# Patient Record
Sex: Male | Born: 1985 | Race: Black or African American | Hispanic: No | Marital: Married | Smoking: Never smoker
Health system: Southern US, Community
[De-identification: ages and names within clinical notes are randomized; demographics above are authoritative.]

## PROBLEM LIST (undated history)

## (undated) DIAGNOSIS — E7800 Pure hypercholesterolemia, unspecified: Secondary | ICD-10-CM

---
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
## 2008-03-11 ENCOUNTER — Emergency Department (HOSPITAL_COMMUNITY): Admission: EM | Admit: 2008-03-11 | Discharge: 2008-03-11 | Payer: Self-pay | Admitting: Emergency Medicine

## 2009-12-01 ENCOUNTER — Emergency Department (HOSPITAL_COMMUNITY): Admission: EM | Admit: 2009-12-01 | Discharge: 2009-12-01 | Payer: Self-pay | Admitting: Emergency Medicine

## 2011-05-12 ENCOUNTER — Encounter: Payer: Self-pay | Admitting: *Deleted

## 2011-05-12 ENCOUNTER — Emergency Department (HOSPITAL_COMMUNITY)
Admission: EM | Admit: 2011-05-12 | Discharge: 2011-05-12 | Disposition: A | Discharge status: Home / Self Care | Payer: Worker's Compensation | Attending: Emergency Medicine | Admitting: Emergency Medicine

## 2011-05-12 DIAGNOSIS — X500XXA Overexertion from strenuous movement or load, initial encounter: Secondary | ICD-10-CM | POA: Insufficient documentation

## 2011-05-12 DIAGNOSIS — M545 Low back pain, unspecified: Secondary | ICD-10-CM | POA: Insufficient documentation

## 2011-05-12 DIAGNOSIS — T148XXA Other injury of unspecified body region, initial encounter: Secondary | ICD-10-CM | POA: Insufficient documentation

## 2011-05-12 DIAGNOSIS — M62838 Other muscle spasm: Secondary | ICD-10-CM | POA: Insufficient documentation

## 2011-05-12 DIAGNOSIS — IMO0001 Reserved for inherently not codable concepts without codable children: Secondary | ICD-10-CM | POA: Insufficient documentation

## 2011-05-12 MED ORDER — IBUPROFEN 600 MG PO TABS: 600.0000 mg | ORAL_TABLET | Freq: Four times a day (QID) | ORAL | Status: AC | PRN

## 2011-05-12 MED ORDER — CYCLOBENZAPRINE HCL 10 MG PO TABS: 10.0000 mg | ORAL_TABLET | Freq: Three times a day (TID) | ORAL | Status: AC | PRN

## 2011-05-12 NOTE — ED Provider Notes (Signed)
History     CSN: [id number] Arrival date & time: 05/12/2011  6:48 PM   First MD Initiated Contact with Patient 05/12/11 2025      Chief Complaint  Patient presents with  . Back Pain    pt c/o back pain that began at work while pushing an "unloading device" pt noticed sharp shooting pain in back radiating to leg. pt also c/o right hip pain. pt feels like pain is related.      HPI  History provided by the patient. Patient presents with complaints of increasing right lower back pain that began today while at work. Patient works for [employer] and reports having to push a device while leaning forward and felt strain in his back. Since that time he has had increasing pains it worse with movements and walking. he has not taken any medications for his symptoms. He also reports some pain in right buttocks area. There was no other injury or trauma. Patient denies similar symptoms in the past. She has no other significant past medical history   History reviewed. No pertinent past medical history.  History reviewed. No pertinent past surgical history.  History reviewed. No pertinent family history.  History  Substance Use Topics  . Smoking status: Never Smoker   . Smokeless tobacco: Not on file  . Alcohol Use: No      Review of Systems  Gastrointestinal: Negative for nausea, vomiting and abdominal pain.  Genitourinary: Negative for dysuria, frequency, hematuria and flank pain.  All other systems reviewed and are negative.    Allergies  Review of patient's allergies indicates no known allergies.  Home Medications   Current Outpatient Rx  Name Route Sig Dispense Refill  . IBUPROFEN 200 MG PO TABS Oral Take 200 mg by mouth every 6 (six) hours as needed. For headaches and general body pain     . MULTIVITAMINS PO CAPS Oral Take 1 capsule by mouth daily.        BP 154/85  Pulse 74  Temp(Src) 98.1 F (36.7 C) (Oral)  Resp 20  Ht 5\' 7"  (1.702 m)  Wt 165 lb (74.844 kg)  BMI 25.84  kg/m2  Physical Exam  Nursing note and vitals reviewed. Constitutional: He is oriented to person, place, and time. He appears well-developed and well-nourished. No distress.  HENT:  Head: Normocephalic.  Neck: Normal range of motion. Neck supple.  Cardiovascular: Normal rate, regular rhythm and normal heart sounds.   Pulmonary/Chest: Effort normal.  Abdominal: Soft. He exhibits no distension. There is no tenderness. There is no rebound and no CVA tenderness.  Musculoskeletal: Normal range of motion.       Cervical back: Normal.       Thoracic back: Normal.       Lumbar back: He exhibits tenderness.       Back:  Neurological: He is alert and oriented to person, place, and time. He has normal strength. No cranial nerve deficit or sensory deficit. Gait normal.       Normal patellar reflexes bilaterally.  Skin: Skin is warm. No rash noted.  Psychiatric: He has a normal mood and affect. His behavior is normal.    ED Course  Procedures (including critical care time)    1. Muscle strain   2. Muscle spasm       MDM  Patient seen and evaluated. Patient in no acute distress.       Phill Mutter Dexter, [place] 05/13/11 [phone]

## 2011-05-14 NOTE — ED Provider Notes (Signed)
Medical screening examination/treatment/procedure(s) were performed by non-physician practitioner and as supervising physician I was immediately available for consultation/collaboration.  Nikkol Pai R. Weiland Tomich, MD 05/14/11 0022 

## 2012-10-13 ENCOUNTER — Emergency Department (HOSPITAL_COMMUNITY)
Admission: EM | Admit: 2012-10-13 | Discharge: 2012-10-13 | Disposition: A | Discharge status: Home / Self Care | Payer: BC Managed Care – PPO | Attending: Emergency Medicine | Admitting: Emergency Medicine

## 2012-10-13 ENCOUNTER — Encounter (HOSPITAL_COMMUNITY): Payer: Self-pay

## 2012-10-13 ENCOUNTER — Emergency Department (HOSPITAL_COMMUNITY): Payer: BC Managed Care – PPO

## 2012-10-13 DIAGNOSIS — R109 Unspecified abdominal pain: Secondary | ICD-10-CM | POA: Insufficient documentation

## 2012-10-13 DIAGNOSIS — M549 Dorsalgia, unspecified: Secondary | ICD-10-CM

## 2012-10-13 DIAGNOSIS — M545 Low back pain, unspecified: Secondary | ICD-10-CM | POA: Insufficient documentation

## 2012-10-13 DIAGNOSIS — Z79899 Other long term (current) drug therapy: Secondary | ICD-10-CM | POA: Insufficient documentation

## 2012-10-13 DIAGNOSIS — K5900 Constipation, unspecified: Secondary | ICD-10-CM | POA: Insufficient documentation

## 2012-10-13 LAB — CBC WITH DIFFERENTIAL/PLATELET
Eosinophils Relative: 1 % (ref 0–5)
HCT: 45.4 % (ref 39.0–52.0)
Lymphocytes Relative: 54 % — ABNORMAL HIGH (ref 12–46)
Lymphs Abs: 1.4 10*3/uL (ref 0.7–4.0)
MCV: 83.9 fL (ref 78.0–100.0)
Monocytes Relative: 7 % (ref 3–12)
Neutro Abs: 1 10*3/uL — ABNORMAL LOW (ref 1.7–7.7)
Platelets: 262 10*3/uL (ref 150–400)
RBC: 5.41 MIL/uL (ref 4.22–5.81)
WBC: 2.6 10*3/uL — ABNORMAL LOW (ref 4.0–10.5)

## 2012-10-13 LAB — URINE MICROSCOPIC-ADD ON

## 2012-10-13 LAB — URINALYSIS, ROUTINE W REFLEX MICROSCOPIC
Glucose, UA: NEGATIVE mg/dL
Hgb urine dipstick: NEGATIVE
Specific Gravity, Urine: 1.025 (ref 1.005–1.030)

## 2012-10-13 LAB — BASIC METABOLIC PANEL
CO2: 31 mEq/L (ref 19–32)
Chloride: 98 mEq/L (ref 96–112)
Creatinine, Ser: 1.01 mg/dL (ref 0.50–1.35)
Potassium: 4.2 mEq/L (ref 3.5–5.1)

## 2012-10-13 MED ORDER — TRAMADOL HCL 50 MG PO TABS: 50.0000 mg | ORAL_TABLET | Freq: Four times a day (QID) | ORAL | Status: DC | PRN

## 2012-10-13 MED ORDER — MAGNESIUM CITRATE PO SOLN: ORAL | Status: DC

## 2012-10-13 MED ORDER — POLYETHYLENE GLYCOL 3350 17 GM/SCOOP PO POWD: 17.0000 g | Freq: Every day | ORAL | Status: DC

## 2012-10-13 NOTE — ED Notes (Signed)
Complain of " pelvic pain " and some constipation. Last BM today

## 2012-10-13 NOTE — ED Provider Notes (Signed)
Medical screening examination/treatment/procedure(s) were performed by non-physician practitioner and as supervising physician I was immediately available for consultation/collaboration.   Glynn Octave, MD 10/13/12 1806

## 2012-10-13 NOTE — ED Provider Notes (Signed)
History     CSN: [id number]  Arrival date & time 10/13/12  1330   First MD Initiated Contact with Patient 10/13/12 1341      Chief Complaint  Patient presents with  . Constipation    (Consider location/radiation/quality/duration/timing/severity/associated sxs/prior treatment) HPI Comments: Colin Cunningham is a 27 y.o. Male presenting with a 3 day history of low back pain which is radiating around right  flank to his right lower quadrant,  And is sharp and triggered by movement such as sitting up, standing, walking and twisting his torso.  He cannot bend forward at the waist secondary to severe pain.  He does get better at rest, but the pain slowly gets worse after he has been lying in one position too long,  Then he has to move to improve the pain.  He denies nausea, vomiting,  Anorexia,  Fever, dysuria, diarrhea, but has noticed having harder than normal stools the past 3 days,  But does have a daily bm.  He works as a Equities trader,  As he has for the past 10 years,  And denies any problems with his back or recent injury.  He has taken ibuprofen 600 mg at 12 today with no relief.     The history is provided by the patient.    History reviewed. No pertinent past medical history.  History reviewed. No pertinent past surgical history.  No family history on file.  History  Substance Use Topics  . Smoking status: Never Smoker   . Smokeless tobacco: Not on file  . Alcohol Use: No      Review of Systems  Constitutional: Negative for fever, chills and appetite change.  HENT: Negative for congestion, sore throat and neck pain.   Eyes: Negative.   Respiratory: Negative for chest tightness and shortness of breath.   Cardiovascular: Negative for chest pain.  Gastrointestinal: Positive for abdominal pain and constipation. Negative for nausea, vomiting, diarrhea and abdominal distention.  Genitourinary: Negative.  Negative for dysuria, hematuria, penile swelling and scrotal  swelling.  Musculoskeletal: Negative for joint swelling and arthralgias.  Skin: Negative.  Negative for rash and wound.  Neurological: Negative for dizziness, weakness, light-headedness, numbness and headaches.  Psychiatric/Behavioral: Negative.     Allergies  Review of patient's allergies indicates no known allergies.  Home Medications   Current Outpatient Rx  Name  Route  Sig  Dispense  Refill  . ibuprofen (ADVIL,MOTRIN) 200 MG tablet   Oral   Take 200 mg by mouth every 6 (six) hours as needed. For headaches and general body pain          . Multiple Vitamin (MULTIVITAMIN) capsule   Oral   Take 1 capsule by mouth daily.           . magnesium citrate solution      Drink  1/2 of this solution over 30 minutes,  Then the other 1/2 if you have not had a bowel movement   300 mL   0   . polyethylene glycol powder (GLYCOLAX/MIRALAX) powder   Oral   Take 17 g by mouth daily. For prevention of constipation   527 g   0   . traMADol (ULTRAM) 50 MG tablet   Oral   Take 1 tablet (50 mg total) by mouth every 6 (six) hours as needed for pain.   20 tablet   0     BP 136/77  Pulse 93  Temp(Src) 97.7 F (36.5 C) (Oral)  Resp 18  Ht 5\' 7"  (1.702 m)  Wt 165 lb (74.844 kg)  BMI 25.84 kg/m2  SpO2 98%  Physical Exam  Nursing note and vitals reviewed. Constitutional: He appears well-developed and well-nourished.  HENT:  Head: Normocephalic and atraumatic.  Eyes: Conjunctivae are normal.  Neck: Normal range of motion.  Cardiovascular: Normal rate, regular rhythm, normal heart sounds and intact distal pulses.   Pulmonary/Chest: Effort normal and breath sounds normal. No respiratory distress. He has no wheezes.  Abdominal: Soft. Bowel sounds are normal. He exhibits no distension. There is tenderness. There is no rebound and no guarding.  Musculoskeletal: Normal range of motion.  Lumbar sacral area ttp midline and along right pelvic rim to right lower abdomen.  No rashes, skin  normal, no edema.  Neurological: He is alert.  Skin: Skin is warm and dry.  Psychiatric: He has a normal mood and affect.    ED Course  Procedures (including critical care time)  Labs Reviewed  URINALYSIS, ROUTINE W REFLEX MICROSCOPIC - Abnormal; Notable for the following:    Ketones, ur TRACE (*)    Protein, ur TRACE (*)    All other components within normal limits  CBC WITH DIFFERENTIAL - Abnormal; Notable for the following:    WBC 2.6 (*)    Neutrophils Relative % 38 (*)    Lymphocytes Relative 54 (*)    Neutro Abs 1.0 (*)    All other components within normal limits  BASIC METABOLIC PANEL  URINE MICROSCOPIC-ADD ON   Dg Abd 2 Views  10/13/2012   *RADIOLOGY REPORT*  Clinical Data: Right lower back pain, constipation for 3 days  ABDOMEN - 2 VIEW  Comparison: No  Findings: Upper normal stool burden. No bowel dilatation or evidence of obstruction. No free intraperitoneal air. Osseous structures unremarkable. Small left pelvic phleboliths without definite urinary tract calcification.  IMPRESSION: No acute abnormalities.   Original Report Authenticated By: Ulyses Southward, M.D.     1. Back pain   2. Constipation       MDM  Pt with low back pain and constipation of new onset over the past 3 days.  Back is worse with movement,  Suspect musculoskeletal source. Doubt appy - pt without nausea, no fevers, good appetite, pain is worse with movement and radiates around from midline lumbar spine. Constipation per 2 view abd.  He was prescribed tramadol for back pain,  Encouraged heat tx.  Magnesium citrate/miralax for constipation.  Encouraged return here for any worsened sx, including worse pain,  Or development of nausea, vomiting, fevers.        Burgess Amor, PA-C 10/13/12 1802  Burgess Amor, PA-C 10/13/12 [phone]

## 2016-06-11 ENCOUNTER — Emergency Department (HOSPITAL_COMMUNITY)
Admission: EM | Admit: 2016-06-11 | Discharge: 2016-06-11 | Disposition: A | Discharge status: Home / Self Care | Payer: BLUE CROSS/BLUE SHIELD | Attending: Emergency Medicine | Admitting: Emergency Medicine

## 2016-06-11 ENCOUNTER — Encounter (HOSPITAL_COMMUNITY): Payer: Self-pay | Admitting: Emergency Medicine

## 2016-06-11 DIAGNOSIS — Z79899 Other long term (current) drug therapy: Secondary | ICD-10-CM | POA: Diagnosis not present

## 2016-06-11 DIAGNOSIS — Z791 Long term (current) use of non-steroidal anti-inflammatories (NSAID): Secondary | ICD-10-CM | POA: Insufficient documentation

## 2016-06-11 DIAGNOSIS — K0889 Other specified disorders of teeth and supporting structures: Secondary | ICD-10-CM | POA: Diagnosis not present

## 2016-06-11 MED ORDER — PENICILLIN V POTASSIUM 500 MG PO TABS: 500.0000 mg | ORAL_TABLET | Freq: Four times a day (QID) | ORAL | 0 refills | Status: AC

## 2016-06-11 MED ORDER — NAPROXEN 500 MG PO TABS: 500.0000 mg | ORAL_TABLET | Freq: Two times a day (BID) | ORAL | 0 refills | Status: DC

## 2016-06-11 NOTE — ED Triage Notes (Signed)
Pt states he started having pain in left upper jaw where wisdom tooth is.  Pt states "I started googling it and just wanted the doctor to look at it"

## 2016-06-11 NOTE — ED Provider Notes (Signed)
[facility]EM[facility]PT Provider Note   CSN: [id number] Arrival date & time: 06/11/16  0000  By signing my name below, I, Bobbie Stack, attest that this documentation has been prepared under the direction and in the presence of Shon Baton, MD. Electronically Signed: Bobbie Stack, Scribe. 06/11/16. 12:52 AM. History   Chief Complaint Chief Complaint  Patient presents with  . Dental Pain     The history is provided by the patient. No language interpreter was used.    HPI Comments: Colin Cunningham is a 31 y.o. male who presents to the Emergency Department complaining of left upper jaw pain since earlier today. Patient believes that he has an abscess. He came in today to have it checked before it became any worse. He states that he has been unable to eat anything due to the pain. He reports no pain right now. He denies fever and difficulty swallowing. He has never had this problem in the past.  History reviewed. No pertinent past medical history.  There are no active problems to display for this patient.   History reviewed. No pertinent surgical history.     Home Medications    Prior to Admission medications   Medication Sig Start Date End Date Taking? Authorizing Provider  ibuprofen (ADVIL,MOTRIN) 200 MG tablet Take 200 mg by mouth every 6 (six) hours as needed. For headaches and general body pain     Historical Provider, MD  magnesium citrate solution Drink  1/2 of this solution over 30 minutes,  Then the other 1/2 if you have not had a bowel movement 10/13/12   Burgess Amor, PA-C  Multiple Vitamin (MULTIVITAMIN) capsule Take 1 capsule by mouth daily.      Historical Provider, MD  naproxen (NAPROSYN) 500 MG tablet Take 1 tablet (500 mg total) by mouth 2 (two) times daily. 06/11/16   Shon Baton, MD  penicillin v potassium (VEETID) 500 MG tablet Take 1 tablet (500 mg total) by mouth 4 (four) times daily. 06/11/16 06/18/16  Shon Baton, MD  polyethylene glycol  powder (GLYCOLAX/MIRALAX) powder Take 17 g by mouth daily. For prevention of constipation 10/13/12   Burgess Amor, PA-C  traMADol (ULTRAM) 50 MG tablet Take 1 tablet (50 mg total) by mouth every 6 (six) hours as needed for pain. 10/13/12   Burgess Amor, PA-C    Family History History reviewed. No pertinent family history.  Social History Social History  Substance Use Topics  . Smoking status: Never Smoker  . Smokeless tobacco: Never Used  . Alcohol use No     Allergies   Patient has no known allergies.   Review of Systems Review of Systems  Constitutional: Negative for fever.  HENT: Positive for dental problem (Left upper jaw). Negative for facial swelling and trouble swallowing.   Gastrointestinal: Negative for abdominal pain.  All other systems reviewed and are negative.    Physical Exam Updated Vital Signs BP 147/92 (BP Location: Right Arm)   Pulse 85   Temp 98 F (36.7 C) (Oral)   Resp 18   Ht 5\' 7"  (1.702 m)   Wt 155 lb (70.3 kg)   SpO2 100%   BMI 24.28 kg/m   Physical Exam  Constitutional: He is oriented to person, place, and time. He appears well-developed and well-nourished.  HENT:  Head: Normocephalic and atraumatic.  No facial swelling noted, no trismus, poor dentition with cavities noted left lower molar and premolar, no palpable abscess or tenderness along the gumline, no fullness under  the tongue  Cardiovascular: Normal rate and regular rhythm.   Pulmonary/Chest: Effort normal. No respiratory distress.  Neurological: He is alert and oriented to person, place, and time.  Skin: Skin is warm and dry.  Psychiatric: He has a normal mood and affect.  Nursing note and vitals reviewed.    ED Treatments / Results  DIAGNOSTIC STUDIES: Oxygen Saturation is 100% on RA, normal by my interpretation.    COORDINATION OF CARE: 12:45 AM Discussed treatment plan with pt at bedside and pt agreed to plan.  Labs (all labs ordered are listed, but only abnormal results  are displayed) Labs Reviewed - No data to display  EKG  EKG Interpretation None       Radiology No results found.  Procedures Procedures (including critical care time)  Medications Ordered in ED Medications - No data to display   Initial Impression / Assessment and Plan / ED Course  I have reviewed the triage vital signs and the nursing notes.  Pertinent labs & imaging results that were available during my care of the patient were reviewed by me and considered in my medical decision making (see chart for details).  Clinical Course     Patient presents with dental pain. No obvious abscess on exam. No evidence of deep space infection at this time. Generally poor dentition. Will place on penicillin. Patient states [place]  Follow-up with dentist and naproxen for pain.  After history, exam, and medical workup I feel the patient has been appropriately medically screened and is safe for discharge home. Pertinent diagnoses were discussed with the patient. Patient was given return precautions.   Final Clinical Impressions(s) / ED Diagnoses   Final diagnoses:  Pain, dental    New Prescriptions Discharge Medication List as of 06/11/2016  1:01 AM    START taking these medications   Details  naproxen (NAPROSYN) 500 MG tablet Take 1 tablet (500 mg total) by mouth 2 (two) times daily., Starting Wed 06/11/2016, Print    penicillin v potassium (VEETID) 500 MG tablet Take 1 tablet (500 mg total) by mouth 4 (four) times daily., Starting Wed 06/11/2016, Until Wed 06/18/2016, Print       I personally performed the services described in this documentation, which was scribed in my presence. The recorded information has been reviewed and is accurate.     Shon Batonourtney F Vannie Hochstetler, MD 06/11/16 [phone]

## 2016-06-11 NOTE — ED Notes (Signed)
Pt states understanding of care given and follow up instructions.  Pt ambulated from ED  

## 2016-11-19 ENCOUNTER — Emergency Department (HOSPITAL_COMMUNITY)
Admission: EM | Admit: 2016-11-19 | Discharge: 2016-11-19 | Disposition: A | Discharge status: Home / Self Care | Payer: BLUE CROSS/BLUE SHIELD | Attending: Emergency Medicine | Admitting: Emergency Medicine

## 2016-11-19 ENCOUNTER — Emergency Department (HOSPITAL_COMMUNITY): Payer: BLUE CROSS/BLUE SHIELD

## 2016-11-19 ENCOUNTER — Encounter (HOSPITAL_COMMUNITY): Payer: Self-pay | Admitting: *Deleted

## 2016-11-19 DIAGNOSIS — R519 Headache, unspecified: Secondary | ICD-10-CM

## 2016-11-19 DIAGNOSIS — R51 Headache: Secondary | ICD-10-CM | POA: Insufficient documentation

## 2016-11-19 LAB — BASIC METABOLIC PANEL
ANION GAP: 6 (ref 5–15)
BUN: 13 mg/dL (ref 6–20)
CHLORIDE: 100 mmol/L — AB (ref 101–111)
CO2: 30 mmol/L (ref 22–32)
Calcium: 9.3 mg/dL (ref 8.9–10.3)
Creatinine, Ser: 0.95 mg/dL (ref 0.61–1.24)
GFR calc Af Amer: >60 mL/min (ref >60)
GFR calc non Af Amer: >60 mL/min (ref >60)
Glucose, Bld: 122 mg/dL — ABNORMAL HIGH (ref 65–99)
POTASSIUM: 4.1 mmol/L (ref 3.5–5.1)
Sodium: 136 mmol/L (ref 135–145)

## 2016-11-19 LAB — CBC WITH DIFFERENTIAL/PLATELET
BASOS ABS: 0 10*3/uL (ref 0.0–0.1)
Basophils Relative: 0 %
Eosinophils Absolute: 0.1 10*3/uL (ref 0.0–0.7)
Eosinophils Relative: 2 %
HEMATOCRIT: 43.5 % (ref 39.0–52.0)
HEMOGLOBIN: 14.4 g/dL (ref 13.0–17.0)
LYMPHS PCT: 48 %
Lymphs Abs: 1.8 10*3/uL (ref 0.7–4.0)
MCH: 27.6 pg (ref 26.0–34.0)
MCHC: 33.1 g/dL (ref 30.0–36.0)
MCV: 83.5 fL (ref 78.0–100.0)
Monocytes Absolute: 0.1 10*3/uL (ref 0.1–1.0)
Monocytes Relative: 4 %
NEUTROS ABS: 1.7 10*3/uL (ref 1.7–7.7)
NEUTROS PCT: 46 %
Platelets: 250 10*3/uL (ref 150–400)
RBC: 5.21 MIL/uL (ref 4.22–5.81)
RDW: 13.1 % (ref 11.5–15.5)
WBC: 3.7 10*3/uL — ABNORMAL LOW (ref 4.0–10.5)

## 2016-11-19 MED ORDER — NAPROXEN 500 MG PO TABS: 500.0000 mg | ORAL_TABLET | Freq: Two times a day (BID) | ORAL | 0 refills | Status: DC

## 2016-11-19 NOTE — Discharge Instructions (Signed)
Take the Naprosyn as directed. If the headache does not resolve over the next week or so follow-up with neurology for further evaluation. Return for any new or worse symptoms.

## 2016-11-19 NOTE — ED Triage Notes (Signed)
Pt c/o right sided headache and around right eye, lethargy, nausea x 3 weeks. Denies vomiting. Reports bright light sensitivity.

## 2016-11-19 NOTE — ED Provider Notes (Signed)
[facility] Provider Note   CSN: [id number] Arrival date & time: 11/19/16  1521     History   Chief Complaint Chief Complaint  Patient presents with  . Headache    HPI Colin Cunningham is a 31 y.o. male.   Patient with complaint of headache right temporal area and around the right eye and right maxillary area. Present for 3 weeks.    No nausea no vomiting there is some slight photophobia and no fevers. No history of migraine headaches past headache problems. No neck stiffness. No rash. No history of injury.      History reviewed. No pertinent past medical history.  There are no active problems to display for this patient.   History reviewed. No pertinent surgical history.     Home Medications    Prior to Admission medications   Medication Sig Start Date End Date Taking? Authorizing Provider  HYDROcodone-acetaminophen (NORCO/VICODIN) 5-325 MG tablet Take 1 tablet by mouth every 8 (eight) hours as needed for pain. 11/18/16  Yes [provider]  ibuprofen (ADVIL,MOTRIN) 200 MG tablet Take 699 mg by mouth every 6 (six) hours as needed. For headaches and general body pain    Yes [provider]  Multiple Vitamin (MULTIVITAMIN) capsule Take 1 capsule by mouth daily.     Yes [provider]  naproxen (NAPROSYN) 500 MG tablet Take 1 tablet (500 mg total) by mouth 2 (two) times daily. 11/19/16   Vanetta Mulders, MD    Family History No family history on file.  Social History Social History  Substance Use Topics  . Smoking status: Never Smoker  . Smokeless tobacco: Never Used  . Alcohol use No     Allergies   Patient has no known allergies.   Review of Systems Review of Systems  Constitutional: Negative for fever.  HENT: Negative for congestion, ear pain, facial swelling and sinus pressure.   Eyes: Positive for photophobia. Negative for visual disturbance.  Respiratory: Negative for shortness of breath.   Cardiovascular:  Negative for chest pain.  Gastrointestinal: Negative for abdominal pain, nausea and vomiting.  Genitourinary: Negative for dysuria and hematuria.  Musculoskeletal: Negative for back pain and neck stiffness.  Skin: Negative for rash.  Neurological: Positive for headaches. Negative for dizziness, seizures, syncope, facial asymmetry, speech difficulty, weakness, light-headedness and numbness.  Hematological: Does not bruise/bleed easily.  Psychiatric/Behavioral: Negative for confusion.     Physical Exam Updated Vital Signs BP 132/79 (BP Location: Right Arm)   Pulse 65   Temp 97.7 F (36.5 C) (Oral)   Resp 18   Ht 1.727 m (5\' 8" )   Wt 74.8 kg (165 lb)   SpO2 99%   BMI 25.09 kg/m   Physical Exam  Constitutional: He appears well-developed and well-nourished. No distress.  HENT:  Head: Normocephalic and atraumatic.  Mouth/Throat: Oropharynx is clear and moist.  No tenderness to palpation of the sinuses.  Eyes: Conjunctivae and EOM are normal. Pupils are equal, round, and reactive to light.  Neck: Normal range of motion. Neck supple.  Cardiovascular: Normal rate and regular rhythm.   Pulmonary/Chest: Effort normal and breath sounds normal.  Abdominal: Soft. Bowel sounds are normal. There is no tenderness.  Musculoskeletal: Normal range of motion.  Neurological: He is alert. No cranial nerve deficit or sensory deficit. He exhibits normal muscle tone. Coordination normal.  Skin: Skin is warm.  Nursing note and vitals reviewed.    ED Treatments / Results  Labs (all labs ordered are listed, but  only abnormal results are displayed) Labs Reviewed  CBC WITH DIFFERENTIAL/PLATELET - Abnormal; Notable for the following:       Result Value   WBC 3.7 (*)    All other components within normal limits  BASIC METABOLIC PANEL - Abnormal; Notable for the following:    Chloride 100 (*)    Glucose, Bld 122 (*)    All other components within normal limits    EKG  EKG  Interpretation None       Radiology Ct Head Wo Contrast  Result Date: 11/19/2016 CLINICAL DATA:  31 year old male with right temporal headache and nausea for 3 weeks. EXAM: CT HEAD WITHOUT CONTRAST TECHNIQUE: Contiguous axial images were obtained from the base of the skull through the vertex without intravenous contrast. COMPARISON:  None. FINDINGS: Brain: No evidence of infarction, hemorrhage, hydrocephalus, extra-axial collection or mass lesion/mass effect. Vascular: No hyperdense vessel or unexpected calcification. Skull: Normal. Negative for fracture or focal lesion. Sinuses/Orbits: No acute finding. Other: None. IMPRESSION: Unremarkable noncontrast head CT. Electronically Signed   By: Harmon PierJeffrey  Hu M.D.   On: 11/19/2016 19:39    Procedures Procedures (including critical care time)  Medications Ordered in ED Medications - No data to display   Initial Impression / Assessment and Plan / ED Course  I have reviewed the triage vital signs and the nursing notes.  Pertinent labs & imaging results that were available during my care of the patient were reviewed by me and considered in my medical decision making (see chart for details).     Patient was to 3 week history of a right sided headache around the right eye and temporal area. Head CT here today negative. No evidence sinusitis. Labs without significant abnormalities. Will treat patient with Naprosyn headache persist have him follow-up with neurology. Return for new or worse symptoms.   Final Clinical Impressions(s) / ED Diagnoses   Final diagnoses:  Headache disorder    New Prescriptions New Prescriptions   NAPROXEN (NAPROSYN) 500 MG TABLET    Take 1 tablet (500 mg total) by mouth 2 (two) times daily.     Vanetta MuldersZackowski, Laquasha Groome, MD 11/19/16 [id number]

## 2018-09-16 IMAGING — CT CT HEAD W/O CM
3 series · 16 of 47 positions shown, 19 images · non-contrast
Comparison: None.

CLINICAL DATA: 31-year-old male with right temporal headache and
nausea for 3 weeks.

EXAM:
CT HEAD WITHOUT CONTRAST
TECHNIQUE: Contiguous axial images were obtained from the base of the skull
through the vertex without intravenous contrast.

[Series 2: head wo · axial · 0.43mm/px · z∈[+1448,+1578]mm · 10 of 32 slices shown, 13 images]
[im 3/32  brain]
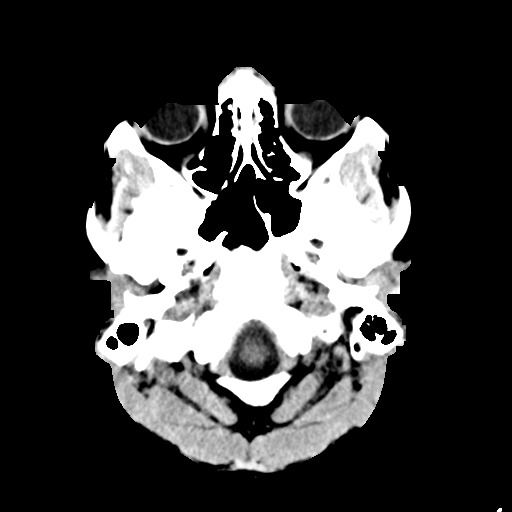
[im 3/32  bone]
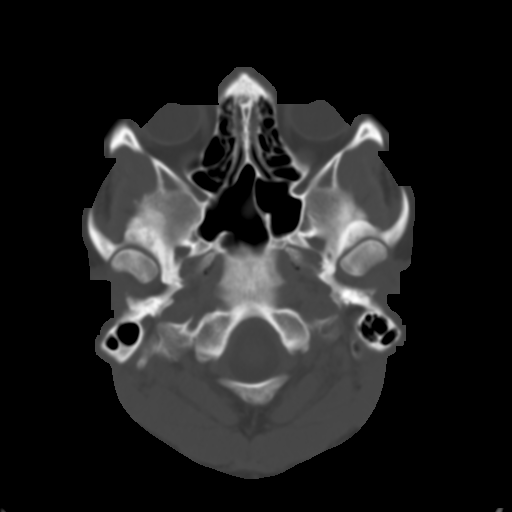
[im 6/32  brain]
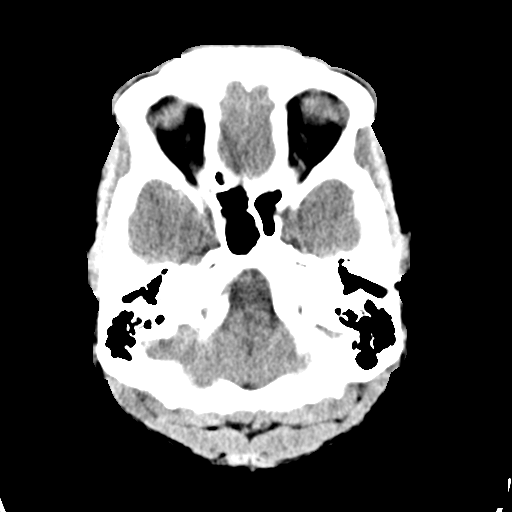
[im 9/32  brain]
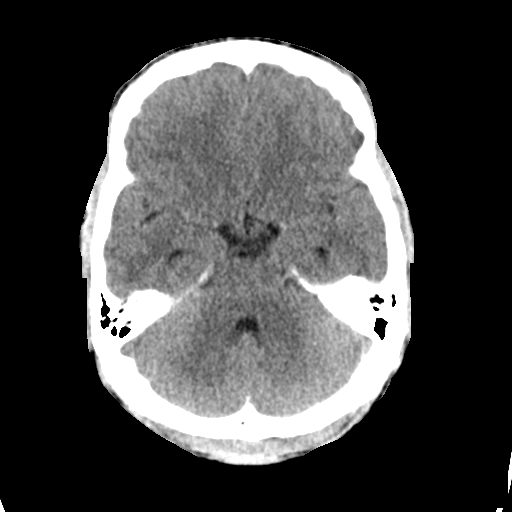
[im 11/32  brain]
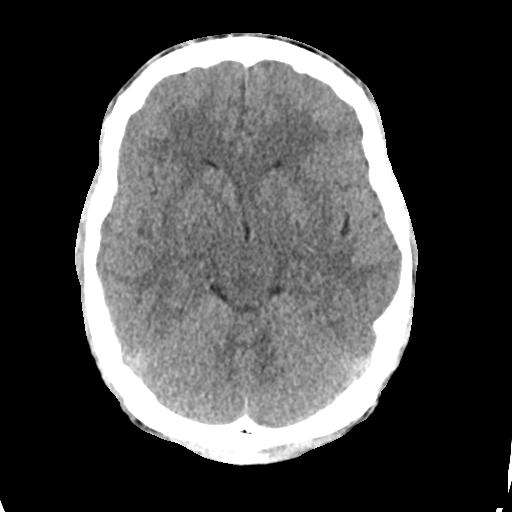
[im 14/32  brain]
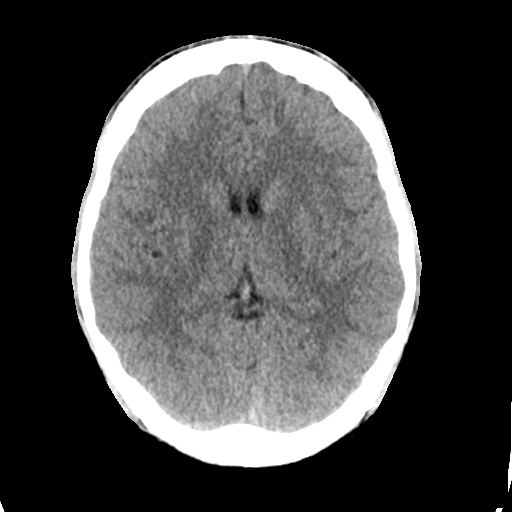
[im 14/32  bone]
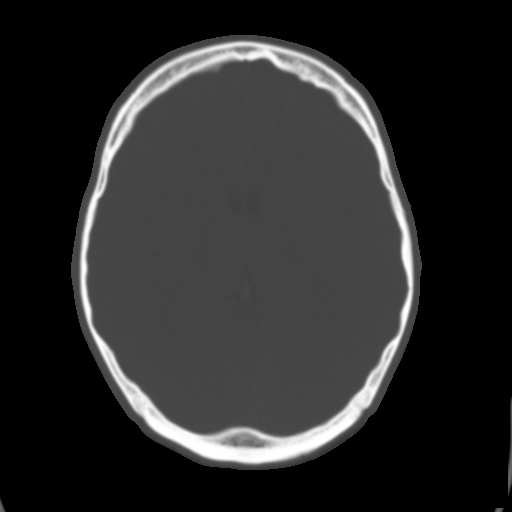
[im 18/32  brain]
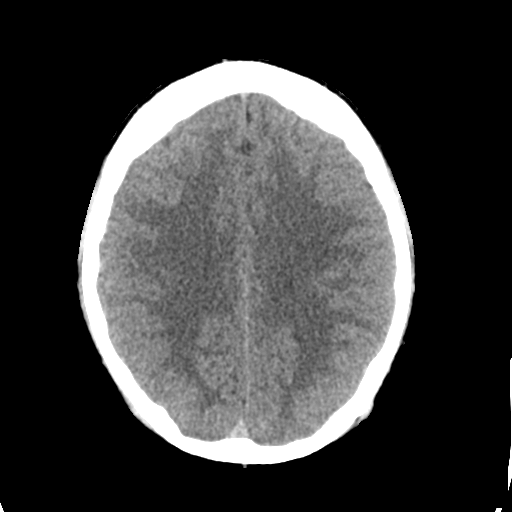
[im 21/32  brain]
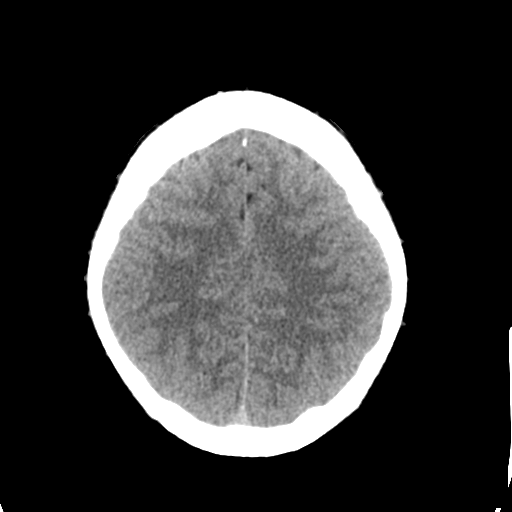
[im 24/32  brain]
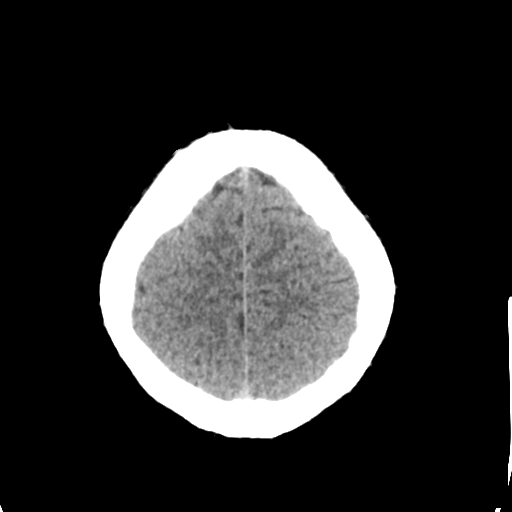
[im 26/32  brain]
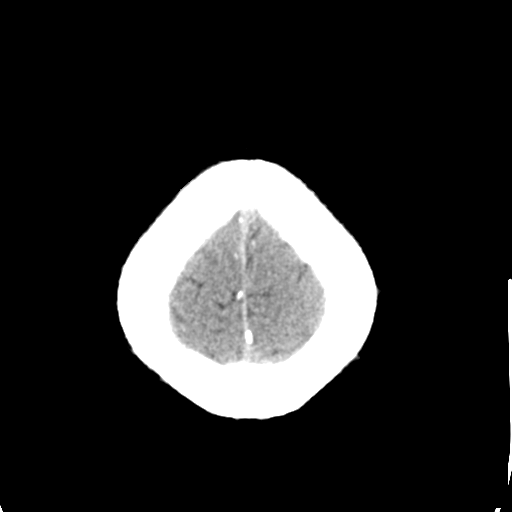
[im 26/32  bone]
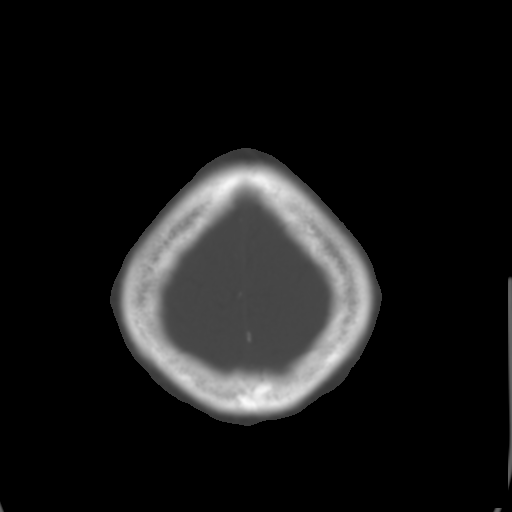
[im 29/32  brain]
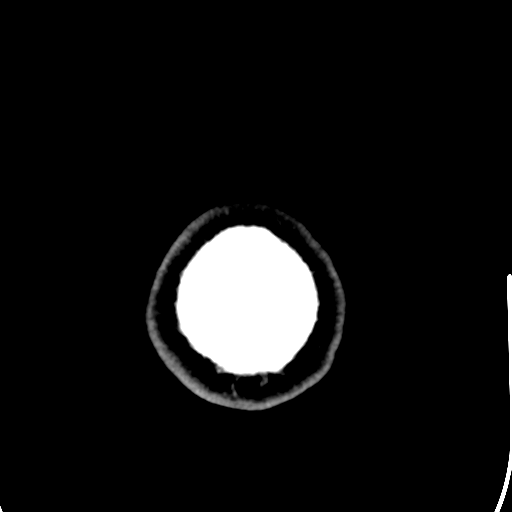

[Series 4: coronal soft tissue · coronal · 0.32mm/px · 3 of 70 slices shown]
[im 24/70  brain]
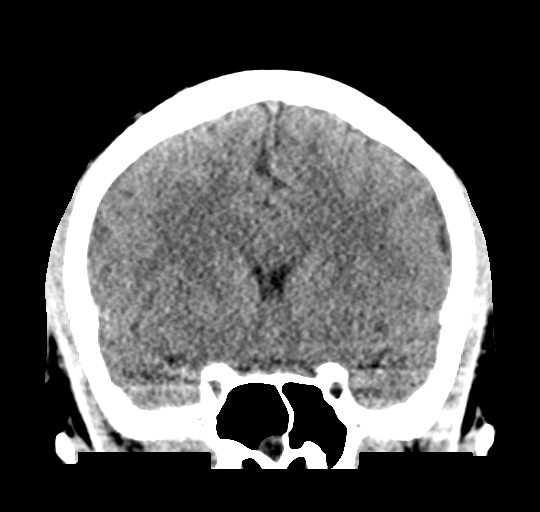
[im 31/70  brain]
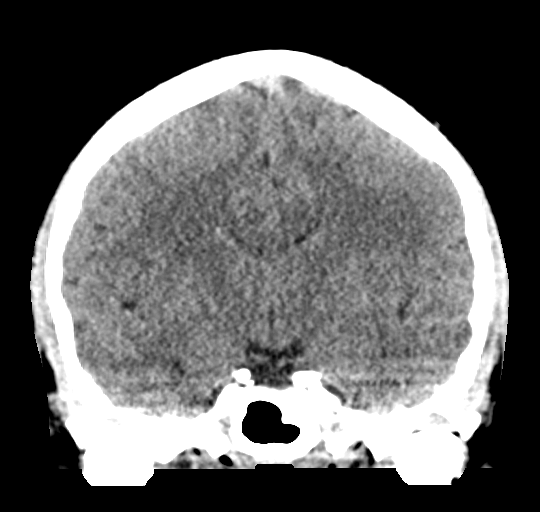
[im 39/70  brain]
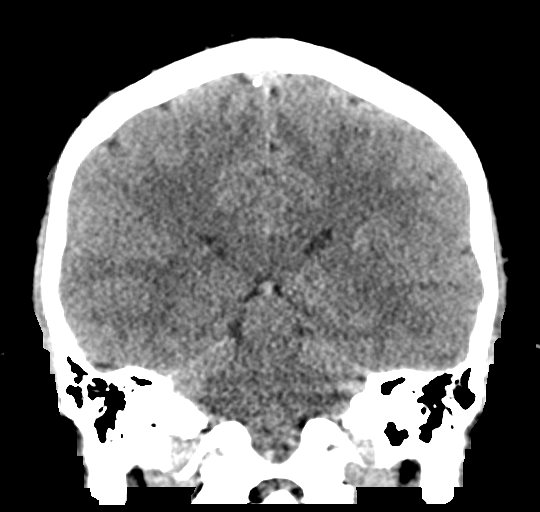

[Series 5: sagittal soft tissue · sagittal · 0.33mm/px · 3 of 56 slices shown]
[im 19/56  brain]
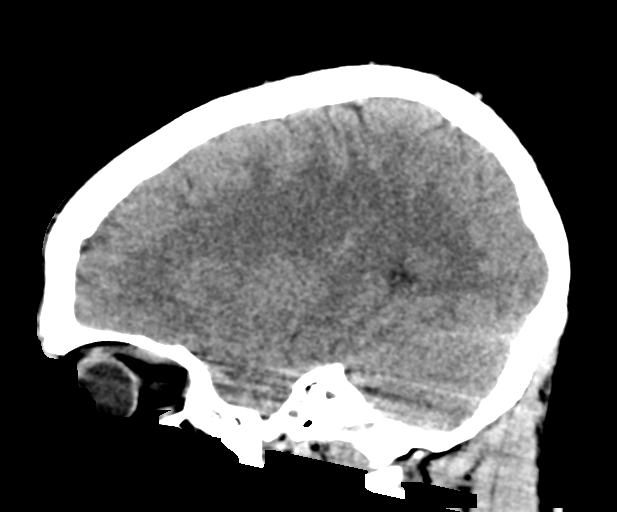
[im 28/56  brain]
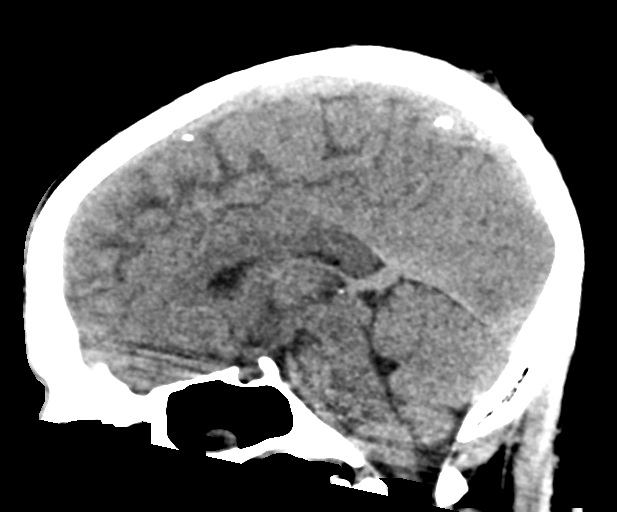
[im 37/56  brain]
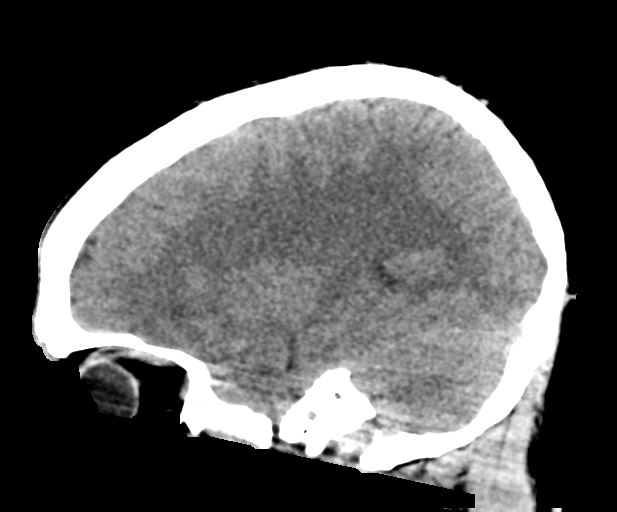

[16 of 47 positions shown; findings below may reference images not displayed]

FINDINGS: Brain: No evidence of infarction, hemorrhage, hydrocephalus,
extra-axial collection or mass lesion/mass effect.

Vascular: No hyperdense vessel or unexpected calcification.

Skull: Normal. Negative for fracture or focal lesion.

Sinuses/Orbits: No acute finding.

Other: None.
IMPRESSION: Unremarkable noncontrast head CT.

## 2020-08-08 ENCOUNTER — Ambulatory Visit: Payer: BLUE CROSS/BLUE SHIELD | Admitting: Nurse Practitioner

## 2020-08-27 ENCOUNTER — Other Ambulatory Visit: Payer: Self-pay

## 2020-08-27 ENCOUNTER — Ambulatory Visit: Payer: BC Managed Care – PPO | Admitting: Internal Medicine

## 2020-08-27 ENCOUNTER — Encounter: Payer: Self-pay | Admitting: Internal Medicine

## 2020-08-27 VITALS — BP 134/84 | HR 74 | Resp 18 | Ht 69.0 in | Wt 182.1 lb

## 2020-08-27 DIAGNOSIS — Z7689 Persons encountering health services in other specified circumstances: Secondary | ICD-10-CM | POA: Diagnosis not present

## 2020-08-27 DIAGNOSIS — R030 Elevated blood-pressure reading, without diagnosis of hypertension: Secondary | ICD-10-CM | POA: Diagnosis not present

## 2020-08-27 DIAGNOSIS — Z1159 Encounter for screening for other viral diseases: Secondary | ICD-10-CM

## 2020-08-27 DIAGNOSIS — R7303 Prediabetes: Secondary | ICD-10-CM

## 2020-08-27 DIAGNOSIS — E785 Hyperlipidemia, unspecified: Secondary | ICD-10-CM

## 2020-08-27 DIAGNOSIS — Z114 Encounter for screening for human immunodeficiency virus [HIV]: Secondary | ICD-10-CM

## 2020-08-27 MED ORDER — ATORVASTATIN CALCIUM 40 MG PO TABS: ORAL_TABLET | ORAL | 1 refills | Status: DC

## 2020-08-27 NOTE — Assessment & Plan Note (Signed)
Care established Previous chart reviewed History and medications reviewed with the patient 

## 2020-08-27 NOTE — Assessment & Plan Note (Signed)
BP Readings from Last 1 Encounters:  08/27/20 134/84   Advised DASH diet and moderate exercise/walking, at least 150 mins/week

## 2020-08-27 NOTE — Patient Instructions (Addendum)
Please continue to take medications as prescribed.  Please follow DASH diet and perform moderate exercise/walking at least 150 mins/week.   [email or web address]">  DASH Eating Plan DASH stands for Dietary Approaches to Stop Hypertension. The DASH eating plan is a healthy eating plan that has been shown to:  Reduce high blood pressure (hypertension).  Reduce your risk for type 2 diabetes, heart disease, and stroke.  Help with weight loss. What are tips for following this plan? Reading food labels  Check food labels for the amount of salt (sodium) per serving. Choose foods with less than 5 percent of the Daily Value of sodium. Generally, foods with less than 300 milligrams (mg) of sodium per serving fit into this eating plan.  To find whole grains, look for the word "whole" as the first word in the ingredient list. Shopping  Buy products labeled as "low-sodium" or "no salt added."  Buy fresh foods. Avoid canned foods and pre-made or frozen meals. Cooking  Avoid adding salt when cooking. Use salt-free seasonings or herbs instead of table salt or sea salt. Check with your health care provider or pharmacist before using salt substitutes.  Do not fry foods. Cook foods using healthy methods such as baking, boiling, grilling, roasting, and broiling instead.  Cook with heart-healthy oils, such as olive, canola, avocado, soybean, or sunflower oil. Meal planning  Eat a balanced diet that includes: ? 4 or more servings of fruits and 4 or more servings of vegetables each day. Try to fill one-half of your plate with fruits and vegetables. ? 6-8 servings of whole grains each day. ? Less than 6 oz (170 g) of lean meat, poultry, or fish each day. A 3-oz (85-g) serving of meat is about the same size as a deck of cards. One egg equals 1 oz (28 g). ? 2-3 servings of low-fat dairy each day. One serving is 1 cup (237 mL). ? 1 serving of nuts, seeds, or  beans 5 times each week. ? 2-3 servings of heart-healthy fats. Healthy fats called omega-3 fatty acids are found in foods such as walnuts, flaxseeds, fortified milks, and eggs. These fats are also found in cold-water fish, such as sardines, salmon, and mackerel.  Limit how much you eat of: ? Canned or prepackaged foods. ? Food that is high in trans fat, such as some fried foods. ? Food that is high in saturated fat, such as fatty meat. ? Desserts and other sweets, sugary drinks, and other foods with added sugar. ? Full-fat dairy products.  Do not salt foods before eating.  Do not eat more than 4 egg yolks a week.  Try to eat at least 2 vegetarian meals a week.  Eat more home-cooked food and less restaurant, buffet, and fast food.   Lifestyle  When eating at a restaurant, ask that your food be prepared with less salt or no salt, if possible.  If you drink alcohol: ? Limit how much you use to:  0-1 drink a day for women who are not pregnant.  0-2 drinks a day for men. ? Be aware of how much alcohol is in your drink. In the [place] one drink equals one 12 oz bottle of beer (355 mL), one 5 oz glass of wine (148 mL), or one 1 oz glass of hard liquor (44 mL). General information  Avoid eating more than 2,300 mg of salt a day. If you have hypertension, you may need to reduce your sodium intake to 1,500 mg a day.  Work  with your health care provider to maintain a healthy body weight or to lose weight. Ask what an ideal weight is for you.  Get at least 30 minutes of exercise that causes your heart to beat faster (aerobic exercise) most days of the week. Activities may include walking, swimming, or biking.  Work with your health care provider or dietitian to adjust your eating plan to your individual calorie needs. What foods should I eat? Fruits All fresh, dried, or frozen fruit. Canned fruit in natural juice (without added sugar). Vegetables Fresh or frozen vegetables (raw, steamed,  roasted, or grilled). Low-sodium or reduced-sodium tomato and vegetable juice. Low-sodium or reduced-sodium tomato sauce and tomato paste. Low-sodium or reduced-sodium canned vegetables. Grains Whole-grain or whole-wheat bread. Whole-grain or whole-wheat pasta. Brown rice. [place]. Bulgur. Whole-grain and low-sodium cereals. Pita bread. Low-fat, low-sodium crackers. Whole-wheat flour tortillas. Meats and other proteins Skinless chicken or [place]. Ground chicken or [place]. Pork with fat trimmed off. Fish and seafood. Egg whites. Dried beans, peas, or lentils. Unsalted nuts, nut butters, and seeds. Unsalted canned beans. Lean cuts of beef with fat trimmed off. Low-sodium, lean precooked or cured meat, such as sausages or meat loaves. Dairy Low-fat (1%) or fat-free (skim) milk. Reduced-fat, low-fat, or fat-free cheeses. Nonfat, low-sodium ricotta or cottage cheese. Low-fat or nonfat yogurt. Low-fat, low-sodium cheese. Fats and oils Soft margarine without trans fats. Vegetable oil. Reduced-fat, low-fat, or light mayonnaise and salad dressings (reduced-sodium). Canola, safflower, olive, avocado, soybean, and sunflower oils. Avocado. Seasonings and condiments Herbs. Spices. Seasoning mixes without salt. Other foods Unsalted popcorn and pretzels. Fat-free sweets. The items listed above may not be a complete list of foods and beverages you can eat. Contact a dietitian for more information. What foods should I avoid? Fruits Canned fruit in a light or heavy syrup. Fried fruit. Fruit in cream or butter sauce. Vegetables Creamed or fried vegetables. Vegetables in a cheese sauce. Regular canned vegetables (not low-sodium or reduced-sodium). Regular canned tomato sauce and paste (not low-sodium or reduced-sodium). Regular tomato and vegetable juice (not low-sodium or reduced-sodium). Angie Fava. Olives. Grains Baked goods made with fat, such as croissants, muffins, or some breads. Dry pasta or rice meal  packs. Meats and other proteins Fatty cuts of meat. Ribs. Fried meat. Berniece Salines. Bologna, salami, and other precooked or cured meats, such as sausages or meat loaves. Fat from the back of a pig (fatback). Bratwurst. Salted nuts and seeds. Canned beans with added salt. Canned or smoked fish. Whole eggs or egg yolks. Chicken or [place] with skin. Dairy Whole or 2% milk, cream, and half-and-half. Whole or full-fat cream cheese. Whole-fat or sweetened yogurt. Full-fat cheese. Nondairy creamers. Whipped toppings. Processed cheese and cheese spreads. Fats and oils Butter. Stick margarine. Lard. Shortening. Ghee. Bacon fat. Tropical oils, such as coconut, palm kernel, or palm oil. Seasonings and condiments Onion salt, garlic salt, seasoned salt, table salt, and sea salt. Worcestershire sauce. Tartar sauce. Barbecue sauce. Teriyaki sauce. Soy sauce, including reduced-sodium. Steak sauce. Canned and packaged gravies. Fish sauce. Oyster sauce. Cocktail sauce. Store-bought horseradish. Ketchup. Mustard. Meat flavorings and tenderizers. Bouillon cubes. Hot sauces. Pre-made or packaged marinades. Pre-made or packaged taco seasonings. Relishes. Regular salad dressings. Other foods Salted popcorn and pretzels. The items listed above may not be a complete list of foods and beverages you should avoid. Contact a dietitian for more information. Where to find more information  National Heart, Lung, and Blood Institute: https://wilson-eaton.com/  American Heart Association: www.heart.org  Academy of Nutrition and Dietetics:  www.eatright.[employer]: www.kidney.org Summary  The DASH eating plan is a healthy eating plan that has been shown to reduce high blood pressure (hypertension). It may also reduce your risk for type 2 diabetes, heart disease, and stroke.  When on the DASH eating plan, aim to eat more fresh fruits and vegetables, whole grains, lean proteins, low-fat dairy, and heart-healthy  fats.  With the DASH eating plan, you should limit salt (sodium) intake to 2,300 mg a day. If you have hypertension, you may need to reduce your sodium intake to 1,500 mg a day.  Work with your health care provider or dietitian to adjust your eating plan to your individual calorie needs. This information is not intended to replace advice given to you by your health care provider. Make sure you discuss any questions you have with your health care provider. Document Revised: 04/15/2019 Document Reviewed: 04/15/2019 Elsevier Patient Education  2021 [employer].

## 2020-08-27 NOTE — Progress Notes (Signed)
New Patient Office Visit  Subjective:  Patient ID: Colin Cunningham, male    DOB: [birth year]  Age: 35 y.o. MRN: [id number]  CC:  Chief Complaint  Patient presents with  . New Patient (Initial Visit)    New patient was seeing Donneta Romberg Np at dr halls office just establishing care     HPI Colin Cunningham is a 35 year old male with PMH of HLD and prediabetes who presents for establishing care.  He has been doing well overall.  He has h/o prediabtetes. He follows low carb diet and performs regular exercise.  His BP was 134/84 in the office today. He denies any headache, dizziness, chest pain, dyspnea or palpitations. He takes Atorvastatin for HLD.  He exercises regularly. He asks whether he can take creatine supplement.  He is up-to-date with COVID vaccine.  History reviewed. No pertinent past medical history.  History reviewed. No pertinent surgical history.  History reviewed. No pertinent family history.  Social History   Socioeconomic History  . Marital status: Married    Spouse name: Not on file  . Number of children: Not on file  . Years of education: Not on file  . Highest education level: Not on file  Occupational History  . Not on file  Tobacco Use  . Smoking status: Never Smoker  . Smokeless tobacco: Never Used  Substance and Sexual Activity  . Alcohol use: No  . Drug use: No  . Sexual activity: Not on file  Other Topics Concern  . Not on file  Social History Narrative  . Not on file   Social Determinants of Health   Financial Resource Strain: Not on file  Food Insecurity: Not on file  Transportation Needs: Not on file  Physical Activity: Not on file  Stress: Not on file  Social Connections: Not on file  Intimate Partner Violence: Not on file    ROS Review of Systems  Constitutional: Negative for chills and fever.  HENT: Negative for congestion and sore throat.   Eyes: Negative for pain and discharge.  Respiratory: Negative for cough and  shortness of breath.   Cardiovascular: Negative for chest pain and palpitations.  Gastrointestinal: Negative for constipation, diarrhea, nausea and vomiting.  Endocrine: Negative for polydipsia and polyuria.  Genitourinary: Negative for dysuria and hematuria.  Musculoskeletal: Negative for neck pain and neck stiffness.  Skin: Negative for rash.  Neurological: Negative for dizziness, weakness, numbness and headaches.  Psychiatric/Behavioral: Negative for agitation and behavioral problems.    Objective:   Today's Vitals: BP 134/84 (BP Location: Left Arm, Patient Position: Sitting)   Pulse 74   Resp 18   Ht _0  (1.753 m)   Wt 182 lb 1.9 oz (82.6 kg)   SpO2 96%   BMI 26.89 kg/m   Physical Exam Vitals reviewed.  Constitutional:      General: He is not in acute distress.    Appearance: He is not diaphoretic.  HENT:     Head: Normocephalic and atraumatic.     Nose: Nose normal.     Mouth/Throat:     Mouth: Mucous membranes are moist.  Eyes:     General: No scleral icterus.    Extraocular Movements: Extraocular movements intact.     Pupils: Pupils are equal, round, and reactive to light.  Cardiovascular:     Rate and Rhythm: Normal rate and regular rhythm.     Pulses: Normal pulses.     Heart sounds: Normal heart sounds. No murmur heard.  Pulmonary:     Breath sounds: Normal breath sounds. No wheezing or rales.  Musculoskeletal:     Cervical back: Neck supple. No tenderness.     Right lower leg: No edema.     Left lower leg: No edema.  Skin:    General: Skin is warm.     Findings: No rash.  Neurological:     General: No focal deficit present.     Mental Status: He is alert and oriented to person, place, and time.  Psychiatric:        Mood and Affect: Mood normal.        Behavior: Behavior normal.     Assessment & Plan:   Problem List Items Addressed This Visit      Other   Encounter to establish care - Primary    Care established Previous chart  reviewed History and medications reviewed with the patient      Relevant Orders   CBC with Differential   CMP14+EGFR   Lipid panel   TSH   Vitamin D (25 hydroxy)   Hyperlipidemia    On Atorvastatin Check lipid profile      Relevant Medications   atorvastatin (LIPITOR) 40 MG tablet   Other Relevant Orders   Lipid panel   Prediabetes    Check HbA1C Continue to follow low carb diet      Relevant Orders   CMP14+EGFR   Hemoglobin A1c   Prehypertension    BP Readings from Last 1 Encounters:  08/27/20 134/84   Advised DASH diet and moderate exercise/walking, at least 150 mins/week        Other Visit Diagnoses    Need for hepatitis C screening test       Relevant Orders   Hepatitis C Antibody   Encounter for screening for HIV       Relevant Orders   HIV antibody (with reflex)      Outpatient Encounter Medications as of 08/27/2020  Medication Sig  . ibuprofen (ADVIL,MOTRIN) 200 MG tablet Take 699 mg by mouth every 6 (six) hours as needed. For headaches and general body pain  . Multiple Vitamin (MULTIVITAMIN) capsule Take 1 capsule by mouth daily.  . [DISCONTINUED] atorvastatin (LIPITOR) 40 MG tablet SMARTSIG:1 Tablet(s) By Mouth Every Evening  . atorvastatin (LIPITOR) 40 MG tablet 1 Tablet(s) By Mouth Every Evening  . [DISCONTINUED] HYDROcodone-acetaminophen (NORCO/VICODIN) 5-325 MG tablet Take 1 tablet by mouth every 8 (eight) hours as needed for pain. (Patient not taking: Reported on 08/27/2020)  . [DISCONTINUED] naproxen (NAPROSYN) 500 MG tablet Take 1 tablet (500 mg total) by mouth 2 (two) times daily. (Patient not taking: Reported on 08/27/2020)   No facility-administered encounter medications on file as of 08/27/2020.    Follow-up: Return in about 6 months (around 02/26/2021) for Annual physical.   Lindell Spar, MD

## 2020-08-27 NOTE — Assessment & Plan Note (Signed)
Check HbA1C °Continue to follow low carb diet °

## 2020-08-27 NOTE — Assessment & Plan Note (Signed)
On Atorvastatin Check lipid profile 

## 2020-08-28 ENCOUNTER — Other Ambulatory Visit: Payer: Self-pay | Admitting: Internal Medicine

## 2020-08-28 DIAGNOSIS — E559 Vitamin D deficiency, unspecified: Secondary | ICD-10-CM

## 2020-08-28 LAB — CBC WITH DIFFERENTIAL/PLATELET
Basophils Absolute: 0 10*3/uL (ref 0.0–0.2)
Basos: 1 %
EOS (ABSOLUTE): 0 10*3/uL (ref 0.0–0.4)
Eos: 1 %
Hematocrit: 45.3 % (ref 37.5–51.0)
Hemoglobin: 15.3 g/dL (ref 13.0–17.7)
Immature Grans (Abs): 0 10*3/uL (ref 0.0–0.1)
Immature Granulocytes: 0 %
Lymphocytes Absolute: 1.5 10*3/uL (ref 0.7–3.1)
Lymphs: 44 %
MCH: 28.1 pg (ref 26.6–33.0)
MCHC: 33.8 g/dL (ref 31.5–35.7)
MCV: 83 fL (ref 79–97)
Monocytes Absolute: 0.3 10*3/uL (ref 0.1–0.9)
Monocytes: 9 %
Neutrophils Absolute: 1.6 10*3/uL (ref 1.4–7.0)
Neutrophils: 45 %
Platelets: 256 10*3/uL (ref 150–450)
RBC: 5.44 x10E6/uL (ref 4.14–5.80)
RDW: 12.7 % (ref 11.6–15.4)
WBC: 3.5 10*3/uL (ref 3.4–10.8)

## 2020-08-28 LAB — LIPID PANEL
Chol/HDL Ratio: 4.4 ratio (ref 0.0–5.0)
Cholesterol, Total: 259 mg/dL — ABNORMAL HIGH (ref 100–199)
HDL: 59 mg/dL (ref >39)
LDL Chol Calc (NIH): 181 mg/dL — ABNORMAL HIGH (ref 0–99)
Triglycerides: 106 mg/dL (ref 0–149)
VLDL Cholesterol Cal: 19 mg/dL (ref 5–40)

## 2020-08-28 LAB — CMP14+EGFR
ALT: 40 IU/L (ref 0–44)
AST: 29 IU/L (ref 0–40)
Albumin/Globulin Ratio: 1.8 (ref 1.2–2.2)
Albumin: 4.7 g/dL (ref 4.0–5.0)
Alkaline Phosphatase: 55 IU/L (ref 44–121)
BUN/Creatinine Ratio: 9 (ref 9–20)
BUN: 10 mg/dL (ref 6–20)
Bilirubin Total: 0.3 mg/dL (ref 0.0–1.2)
CO2: 23 mmol/L (ref 20–29)
Calcium: 9.5 mg/dL (ref 8.7–10.2)
Chloride: 102 mmol/L (ref 96–106)
Creatinine, Ser: 1.14 mg/dL (ref 0.76–1.27)
Globulin, Total: 2.6 g/dL (ref 1.5–4.5)
Glucose: 89 mg/dL (ref 65–99)
Potassium: 4.3 mmol/L (ref 3.5–5.2)
Sodium: 140 mmol/L (ref 134–144)
Total Protein: 7.3 g/dL (ref 6.0–8.5)
eGFR: 86 mL/min/{1.73_m2} (ref >59)

## 2020-08-28 LAB — HEMOGLOBIN A1C
Est. average glucose Bld gHb Est-mCnc: 120 mg/dL
Hgb A1c MFr Bld: 5.8 % — ABNORMAL HIGH (ref 4.8–5.6)

## 2020-08-28 LAB — VITAMIN D 25 HYDROXY (VIT D DEFICIENCY, FRACTURES): Vit D, 25-Hydroxy: 16.4 ng/mL — ABNORMAL LOW (ref 30.0–100.0)

## 2020-08-28 LAB — TSH: TSH: 1.85 u[IU]/mL (ref 0.450–4.500)

## 2020-08-28 MED ORDER — VITAMIN D (ERGOCALCIFEROL) 1.25 MG (50000 UNIT) PO CAPS: 50000.0000 [IU] | ORAL_CAPSULE | ORAL | 4 refills | Status: DC

## 2020-08-29 LAB — HIV ANTIBODY (ROUTINE TESTING W REFLEX): HIV Screen 4th Generation wRfx: NONREACTIVE

## 2020-08-29 LAB — HEPATITIS C ANTIBODY: Hep C Virus Ab: <0.1 s/co ratio (ref 0.0–0.9)

## 2021-02-26 ENCOUNTER — Ambulatory Visit: Payer: BC Managed Care – PPO | Admitting: Internal Medicine

## 2021-05-01 ENCOUNTER — Telehealth: Payer: BC Managed Care – PPO | Admitting: Nurse Practitioner

## 2021-07-08 ENCOUNTER — Ambulatory Visit (INDEPENDENT_AMBULATORY_CARE_PROVIDER_SITE_OTHER): Payer: BC Managed Care – PPO | Admitting: Internal Medicine

## 2021-07-08 ENCOUNTER — Encounter: Payer: Self-pay | Admitting: Internal Medicine

## 2021-07-08 ENCOUNTER — Other Ambulatory Visit: Payer: Self-pay

## 2021-07-08 VITALS — BP 136/88 | HR 78 | Resp 18 | Ht 68.0 in | Wt 188.0 lb

## 2021-07-08 DIAGNOSIS — E559 Vitamin D deficiency, unspecified: Secondary | ICD-10-CM

## 2021-07-08 DIAGNOSIS — Z0001 Encounter for general adult medical examination with abnormal findings: Secondary | ICD-10-CM

## 2021-07-08 DIAGNOSIS — Z23 Encounter for immunization: Secondary | ICD-10-CM

## 2021-07-08 DIAGNOSIS — E785 Hyperlipidemia, unspecified: Secondary | ICD-10-CM | POA: Diagnosis not present

## 2021-07-08 DIAGNOSIS — E782 Mixed hyperlipidemia: Secondary | ICD-10-CM | POA: Diagnosis not present

## 2021-07-08 MED ORDER — ATORVASTATIN CALCIUM 40 MG PO TABS: 40.0000 mg | ORAL_TABLET | Freq: Every day | ORAL | 3 refills | Status: DC

## 2021-07-08 NOTE — Patient Instructions (Signed)
Please continue to take Atorvastatin for now.  Please start taking Vitamin D 2000 IU once daily.  Please continue to follow low cholesterol diet and perform moderate exercise/walking at least 150 mins/week.

## 2021-07-08 NOTE — Progress Notes (Signed)
Established Patient Office Visit  Subjective:  Patient ID: Colin Cunningham, male    DOB: 01-16-1986  Age: 36 y.o. MRN: [id number]  CC:  Chief Complaint  Patient presents with   Annual Exam    Annual exam     HPI Colin Cunningham is a 36 y.o. male with past medical history of HLD and prediabetes who presents for annual physical.  He has h/o prediabtetes. He follows low carb diet and performs regular exercise. He takes Atorvastatin for HLD.  He received flu vaccine in the office today.  History reviewed. No pertinent past medical history.  History reviewed. No pertinent surgical history.  History reviewed. No pertinent family history.  Social History   Socioeconomic History   Marital status: Married    Spouse name: Not on file   Number of children: Not on file   Years of education: Not on file   Highest education level: Not on file  Occupational History   Not on file  Tobacco Use   Smoking status: Never   Smokeless tobacco: Never  Substance and Sexual Activity   Alcohol use: No   Drug use: No   Sexual activity: Not on file  Other Topics Concern   Not on file  Social History Narrative   Not on file   Social Determinants of Health   Financial Resource Strain: Not on file  Food Insecurity: Not on file  Transportation Needs: Not on file  Physical Activity: Not on file  Stress: Not on file  Social Connections: Not on file  Intimate Partner Violence: Not on file    Outpatient Medications Prior to Visit  Medication Sig Dispense Refill   ibuprofen (ADVIL,MOTRIN) 200 MG tablet Take 699 mg by mouth every 6 (six) hours as needed. For headaches and general body pain     Multiple Vitamin (MULTIVITAMIN) capsule Take 1 capsule by mouth daily.     atorvastatin (LIPITOR) 40 MG tablet 1 Tablet(s) By Mouth Every Evening 90 tablet 1   Vitamin D, Ergocalciferol, (DRISDOL) 1.25 MG (50000 UNIT) CAPS capsule Take 1 capsule (50,000 Units total) by mouth every 7 (seven) days. 5  capsule 4   No facility-administered medications prior to visit.    No Known Allergies  ROS Review of Systems  Constitutional:  Negative for chills and fever.  HENT:  Negative for congestion and sore throat.   Eyes:  Negative for pain and discharge.  Respiratory:  Negative for cough and shortness of breath.   Cardiovascular:  Negative for chest pain and palpitations.  Gastrointestinal:  Negative for constipation, diarrhea, nausea and vomiting.  Endocrine: Negative for polydipsia and polyuria.  Genitourinary:  Negative for dysuria and hematuria.  Musculoskeletal:  Negative for neck pain and neck stiffness.  Skin:  Negative for rash.  Neurological:  Negative for dizziness, weakness, numbness and headaches.  Psychiatric/Behavioral:  Negative for agitation and behavioral problems.      Objective:    Physical Exam Vitals reviewed.  Constitutional:      General: He is not in acute distress.    Appearance: He is not diaphoretic.  HENT:     Head: Normocephalic and atraumatic.     Nose: Nose normal.     Mouth/Throat:     Mouth: Mucous membranes are moist.  Eyes:     General: No scleral icterus.    Extraocular Movements: Extraocular movements intact.     Pupils: Pupils are equal, round, and reactive to light.  Cardiovascular:     Rate  and Rhythm: Normal rate and regular rhythm.     Pulses: Normal pulses.     Heart sounds: Normal heart sounds. No murmur heard. Pulmonary:     Breath sounds: Normal breath sounds. No wheezing or rales.  Abdominal:     Palpations: Abdomen is soft.     Tenderness: There is no abdominal tenderness.  Musculoskeletal:     Cervical back: Neck supple. No tenderness.     Right lower leg: No edema.     Left lower leg: No edema.  Skin:    General: Skin is warm.     Findings: No rash.  Neurological:     General: No focal deficit present.     Mental Status: He is alert and oriented to person, place, and time.     Sensory: No sensory deficit.      Motor: No weakness.  Psychiatric:        Mood and Affect: Mood normal.        Behavior: Behavior normal.    BP 136/88 (BP Location: Right Arm, Patient Position: Sitting, Cuff Size: Normal)    Pulse 78    Resp 18    Ht $R'5\' 8"'Ge$  (1.727 m)    Wt 188 lb (85.3 kg)    SpO2 98%    BMI 28.59 kg/m  Wt Readings from Last 3 Encounters:  07/08/21 188 lb (85.3 kg)  08/27/20 182 lb 1.9 oz (82.6 kg)  11/19/16 165 lb (74.8 kg)    Lab Results  Component Value Date   TSH 2.010 07/08/2021   Lab Results  Component Value Date   WBC 3.9 07/08/2021   HGB 16.0 07/08/2021   HCT 48.0 07/08/2021   MCV 83 07/08/2021   PLT 277 07/08/2021   Lab Results  Component Value Date   NA 140 07/08/2021   K 4.5 07/08/2021   CO2 25 07/08/2021   GLUCOSE 94 07/08/2021   BUN 10 07/08/2021   CREATININE 1.07 07/08/2021   BILITOT 0.3 07/08/2021   ALKPHOS 65 07/08/2021   AST 37 07/08/2021   ALT 49 (H) 07/08/2021   PROT 6.9 07/08/2021   ALBUMIN 4.5 07/08/2021   CALCIUM 9.9 07/08/2021   ANIONGAP 6 11/19/2016   EGFR 92 07/08/2021   Lab Results  Component Value Date   CHOL 176 07/08/2021   Lab Results  Component Value Date   HDL 52 07/08/2021   Lab Results  Component Value Date   LDLCALC 106 (H) 07/08/2021   Lab Results  Component Value Date   TRIG 96 07/08/2021   Lab Results  Component Value Date   CHOLHDL 3.4 07/08/2021   Lab Results  Component Value Date   HGBA1C 6.0 (H) 07/08/2021      Assessment & Plan:   Problem List Items Addressed This Visit       Other   Hyperlipidemia    On Atorvastatin Check lipid profile      Relevant Medications   atorvastatin (LIPITOR) 40 MG tablet   Other Relevant Orders   Lipid Profile (Completed)   Encounter for general adult medical examination with abnormal findings - Primary    Physical exam as documented. Counseling done  re healthy lifestyle involving commitment to 150 minutes exercise per week, heart healthy diet, and attaining healthy  weight.The importance of adequate sleep also discussed. Changes in health habits are decided on by the patient with goals and time frames  set for achieving them. Immunization and cancer screening needs are specifically addressed at this visit.  Relevant Orders   TSH (Completed)   Hemoglobin A1c (Completed)   CMP14+EGFR (Completed)   CBC with Differential/Platelet (Completed)   Other Visit Diagnoses     Vitamin D deficiency       Relevant Orders   VITAMIN D 25 Hydroxy (Vit-D Deficiency, Fractures) (Completed)   Need for immunization against influenza       Relevant Orders   Flu Vaccine QUAD 25mo+IM (Fluarix, Fluzone & Alfiuria Quad PF) (Completed)       Meds ordered this encounter  Medications   atorvastatin (LIPITOR) 40 MG tablet    Sig: Take 1 tablet (40 mg total) by mouth daily.    Dispense:  90 tablet    Refill:  3    Follow-up: Return in about 1 year (around 07/08/2022) for Annual physical.    Lindell Spar, MD

## 2021-07-09 LAB — CMP14+EGFR
ALT: 49 IU/L — ABNORMAL HIGH (ref 0–44)
AST: 37 IU/L (ref 0–40)
Albumin/Globulin Ratio: 1.9 (ref 1.2–2.2)
Albumin: 4.5 g/dL (ref 4.0–5.0)
Alkaline Phosphatase: 65 IU/L (ref 44–121)
BUN/Creatinine Ratio: 9 (ref 9–20)
BUN: 10 mg/dL (ref 6–20)
Bilirubin Total: 0.3 mg/dL (ref 0.0–1.2)
CO2: 25 mmol/L (ref 20–29)
Calcium: 9.9 mg/dL (ref 8.7–10.2)
Chloride: 102 mmol/L (ref 96–106)
Creatinine, Ser: 1.07 mg/dL (ref 0.76–1.27)
Globulin, Total: 2.4 g/dL (ref 1.5–4.5)
Glucose: 94 mg/dL (ref 70–99)
Potassium: 4.5 mmol/L (ref 3.5–5.2)
Sodium: 140 mmol/L (ref 134–144)
Total Protein: 6.9 g/dL (ref 6.0–8.5)
eGFR: 92 mL/min/{1.73_m2} (ref >59)

## 2021-07-09 LAB — CBC WITH DIFFERENTIAL/PLATELET
Basophils Absolute: 0 10*3/uL (ref 0.0–0.2)
Basos: 1 %
EOS (ABSOLUTE): 0 10*3/uL (ref 0.0–0.4)
Eos: 1 %
Hematocrit: 48 % (ref 37.5–51.0)
Hemoglobin: 16 g/dL (ref 13.0–17.7)
Immature Grans (Abs): 0 10*3/uL (ref 0.0–0.1)
Immature Granulocytes: 0 %
Lymphocytes Absolute: 1.7 10*3/uL (ref 0.7–3.1)
Lymphs: 44 %
MCH: 27.7 pg (ref 26.6–33.0)
MCHC: 33.3 g/dL (ref 31.5–35.7)
MCV: 83 fL (ref 79–97)
Monocytes Absolute: 0.3 10*3/uL (ref 0.1–0.9)
Monocytes: 8 %
Neutrophils Absolute: 1.8 10*3/uL (ref 1.4–7.0)
Neutrophils: 46 %
Platelets: 277 10*3/uL (ref 150–450)
RBC: 5.77 x10E6/uL (ref 4.14–5.80)
RDW: 12.9 % (ref 11.6–15.4)
WBC: 3.9 10*3/uL (ref 3.4–10.8)

## 2021-07-09 LAB — HEMOGLOBIN A1C
Est. average glucose Bld gHb Est-mCnc: 126 mg/dL
Hgb A1c MFr Bld: 6 % — ABNORMAL HIGH (ref 4.8–5.6)

## 2021-07-09 LAB — LIPID PANEL
Chol/HDL Ratio: 3.4 ratio (ref 0.0–5.0)
Cholesterol, Total: 176 mg/dL (ref 100–199)
HDL: 52 mg/dL (ref >39)
LDL Chol Calc (NIH): 106 mg/dL — ABNORMAL HIGH (ref 0–99)
Triglycerides: 96 mg/dL (ref 0–149)
VLDL Cholesterol Cal: 18 mg/dL (ref 5–40)

## 2021-07-09 LAB — TSH: TSH: 2.01 u[IU]/mL (ref 0.450–4.500)

## 2021-07-09 LAB — VITAMIN D 25 HYDROXY (VIT D DEFICIENCY, FRACTURES): Vit D, 25-Hydroxy: 21.5 ng/mL — ABNORMAL LOW (ref 30.0–100.0)

## 2021-07-12 NOTE — Assessment & Plan Note (Signed)

## 2021-07-12 NOTE — Assessment & Plan Note (Signed)
On Atorvastatin Check lipid profile 

## 2021-12-26 ENCOUNTER — Encounter: Payer: Self-pay | Admitting: Emergency Medicine

## 2021-12-26 ENCOUNTER — Ambulatory Visit
Admission: EM | Admit: 2021-12-26 | Discharge: 2021-12-26 | Disposition: A | Discharge status: Home / Self Care | Payer: BC Managed Care – PPO | Attending: Family Medicine | Admitting: Family Medicine

## 2021-12-26 DIAGNOSIS — H65192 Other acute nonsuppurative otitis media, left ear: Secondary | ICD-10-CM | POA: Diagnosis not present

## 2021-12-26 HISTORY — DX: Pure hypercholesterolemia, unspecified: E78.00

## 2021-12-26 MED ORDER — PREDNISONE 50 MG PO TABS: ORAL_TABLET | ORAL | 0 refills | Status: DC

## 2021-12-26 MED ORDER — FLUTICASONE PROPIONATE 50 MCG/ACT NA SUSP: 1.0000 | Freq: Two times a day (BID) | NASAL | 2 refills | Status: DC

## 2021-12-26 MED ORDER — PSEUDOEPHEDRINE HCL ER 120 MG PO TB12: 120.0000 mg | ORAL_TABLET | Freq: Two times a day (BID) | ORAL | 0 refills | Status: DC | PRN

## 2021-12-26 NOTE — ED Provider Notes (Signed)
[facility]    CSN: [id number] Arrival date & time: 12/26/21  1441      History   Chief Complaint Chief Complaint  Patient presents with   Ear Fullness    Eat and throat pain - Entered by patient    HPI Colin Cunningham is a 36 y.o. male.   Presenting today with 1 day history of dull achy left ear pain that feels like it is running down to the throat region.  Denies fever, chills, muffled hearing, drainage from the ear, headache, recent illness.  So far not trying anything over-the-counter for symptoms.    Past Medical History:  Diagnosis Date   High cholesterol     Patient Active Problem List   Diagnosis Date Noted   Encounter for general adult medical examination with abnormal findings 07/08/2021   Hyperlipidemia 08/27/2020   Prediabetes 08/27/2020   Prehypertension 08/27/2020    History reviewed. No pertinent surgical history.     Home Medications    Prior to Admission medications   Medication Sig Start Date End Date Taking? Authorizing Provider  fluticasone (FLONASE) 50 MCG/ACT nasal spray Place 1 spray into both nostrils 2 (two) times daily. 12/26/21  Yes Particia Nearing, PA-C  predniSONE (DELTASONE) 50 MG tablet Take 1 tab daily with breakfast x 3 days 12/26/21  Yes Particia Nearing, PA-C  pseudoephedrine (SUDAFED 12 HOUR) 120 MG 12 hr tablet Take 1 tablet (120 mg total) by mouth every 12 (twelve) hours as needed. 12/26/21  Yes Particia Nearing, PA-C  atorvastatin (LIPITOR) 40 MG tablet Take 1 tablet (40 mg total) by mouth daily. 07/08/21   Anabel Halon, MD  ibuprofen (ADVIL,MOTRIN) 200 MG tablet Take 699 mg by mouth every 6 (six) hours as needed. For headaches and general body pain    [provider]  Multiple Vitamin (MULTIVITAMIN) capsule Take 1 capsule by mouth daily.    [provider]    Family History History reviewed. No pertinent family history.  Social History Social History   Tobacco Use    Smoking status: Never   Smokeless tobacco: Never  Substance Use Topics   Alcohol use: No   Drug use: No     Allergies   Patient has no known allergies.   Review of Systems Review of Systems Per HPI  Physical Exam Triage Vital Signs ED Triage Vitals  Enc Vitals Group     BP 12/26/21 1453 127/81     Pulse Rate 12/26/21 1453 83     Resp 12/26/21 1453 18     Temp 12/26/21 1453 98.9 F (37.2 C)     Temp Source 12/26/21 1453 Oral     SpO2 12/26/21 1453 96 %     Weight --      Height --      Head Circumference --      Peak Flow --      Pain Score 12/26/21 1455 6     Pain Loc --      Pain Edu? --      Excl. in GC? --    No data found.  Updated Vital Signs BP 127/81 (BP Location: Right Arm)   Pulse 83   Temp 98.9 F (37.2 C) (Oral)   Resp 18   SpO2 96%   Visual Acuity Right Eye Distance:   Left Eye Distance:   Bilateral Distance:    Right Eye Near:   Left Eye Near:    Bilateral Near:  Physical Exam Vitals and nursing note reviewed.  Constitutional:      Appearance: Normal appearance.  HENT:     Head: Atraumatic.     Right Ear: Tympanic membrane normal.     Ears:     Comments: Left middle ear effusion    Nose: Nose normal.     Mouth/Throat:     Mouth: Mucous membranes are moist.     Pharynx: Oropharynx is clear.  Eyes:     Extraocular Movements: Extraocular movements intact.     Conjunctiva/sclera: Conjunctivae normal.  Cardiovascular:     Rate and Rhythm: Normal rate and regular rhythm.  Pulmonary:     Effort: Pulmonary effort is normal.     Breath sounds: Normal breath sounds.  Musculoskeletal:        General: Normal range of motion.     Cervical back: Normal range of motion and neck supple.  Skin:    General: Skin is warm and dry.  Neurological:     General: No focal deficit present.     Mental Status: He is oriented to person, place, and time.  Psychiatric:        Mood and Affect: Mood normal.        Thought Content: Thought content  normal.        Judgment: Judgment normal.      UC Treatments / Results  Labs (all labs ordered are listed, but only abnormal results are displayed) Labs Reviewed - No data to display  EKG   Radiology No results found.  Procedures Procedures (including critical care time)  Medications Ordered in UC Medications - No data to display  Initial Impression / Assessment and Plan / UC Course  I have reviewed the triage vital signs and the nursing notes.  Pertinent labs & imaging results that were available during my care of the patient were reviewed by me and considered in my medical decision making (see chart for details).     Treat with prednisone, Flonase, Sudafed.  Return for worsening symptoms. Final Clinical Impressions(s) / UC Diagnoses   Final diagnoses:  Acute middle ear effusion, left   Discharge Instructions   None    ED Prescriptions     Medication Sig Dispense Auth. Provider   predniSONE (DELTASONE) 50 MG tablet Take 1 tab daily with breakfast x 3 days 3 tablet Particia Nearing, PA-C   fluticasone [facility]) 50 MCG/ACT nasal spray Place 1 spray into both nostrils 2 (two) times daily. 16 g Particia Nearing, [place]   pseudoephedrine (SUDAFED 12 HOUR) 120 MG 12 hr tablet Take 1 tablet (120 mg total) by mouth every 12 (twelve) hours as needed. 10 tablet Particia Nearing, [place]      PDMP not reviewed this encounter.   Particia Nearing, [place] 12/26/21 1523

## 2021-12-26 NOTE — ED Triage Notes (Signed)
Left ear pain that runs down to throat area that started around 8am today

## 2022-07-08 ENCOUNTER — Other Ambulatory Visit: Payer: Self-pay | Admitting: Internal Medicine

## 2022-07-08 DIAGNOSIS — E782 Mixed hyperlipidemia: Secondary | ICD-10-CM

## 2022-07-09 ENCOUNTER — Encounter: Payer: BC Managed Care – PPO | Admitting: Internal Medicine

## 2022-07-10 ENCOUNTER — Encounter: Payer: Self-pay | Admitting: Internal Medicine

## 2022-08-25 ENCOUNTER — Encounter: Payer: Self-pay | Admitting: Internal Medicine

## 2022-08-25 ENCOUNTER — Ambulatory Visit (INDEPENDENT_AMBULATORY_CARE_PROVIDER_SITE_OTHER): Payer: BC Managed Care – PPO | Admitting: Internal Medicine

## 2022-08-25 VITALS — BP 130/84 | HR 77 | Ht 69.0 in | Wt 193.6 lb

## 2022-08-25 DIAGNOSIS — R7303 Prediabetes: Secondary | ICD-10-CM | POA: Diagnosis not present

## 2022-08-25 DIAGNOSIS — Z23 Encounter for immunization: Secondary | ICD-10-CM | POA: Diagnosis not present

## 2022-08-25 DIAGNOSIS — Z0001 Encounter for general adult medical examination with abnormal findings: Secondary | ICD-10-CM

## 2022-08-25 DIAGNOSIS — E782 Mixed hyperlipidemia: Secondary | ICD-10-CM

## 2022-08-25 DIAGNOSIS — R030 Elevated blood-pressure reading, without diagnosis of hypertension: Secondary | ICD-10-CM | POA: Diagnosis not present

## 2022-08-25 DIAGNOSIS — E559 Vitamin D deficiency, unspecified: Secondary | ICD-10-CM

## 2022-08-25 NOTE — Assessment & Plan Note (Signed)

## 2022-08-25 NOTE — Assessment & Plan Note (Signed)
BP Readings from Last 1 Encounters:  08/25/22 130/84   Advised DASH diet and moderate exercise/walking, at least 150 mins/week

## 2022-08-25 NOTE — Assessment & Plan Note (Signed)
On Atorvastatin Check lipid profile 

## 2022-08-25 NOTE — Patient Instructions (Signed)
Please continue to take medications as prescribed.  Please continue to follow low salt diet and perform moderate exercise/walking at least 150 mins/week. 

## 2022-08-25 NOTE — Progress Notes (Signed)
Established Patient Office Visit  Subjective:  Patient ID: Colin Cunningham, male    DOB: 1985/08/18  Age: 37 y.o. MRN: [id number]  CC:  Chief Complaint  Patient presents with   Annual Exam    Patient wants to discuss better choices for his overall health.    HPI [name] is a 37 y.o. male with past medical history of HLD and prediabetes who presents for annual physical.  He has h/o prediabtetes. He follows low carb diet and performs regular exercise. He takes Atorvastatin for HLD.  He currently works in a first shift, and has to wake up at 2 AM.  He is concerned about his health due to sleep cycle disruptions.  I had discussion about getting at least 7-8 hours of uninterrupted sleep.  He is trying to follow low-carb diet.  He received Tdap vaccine in the office today.  Past Medical History:  Diagnosis Date   High cholesterol     History reviewed. No pertinent surgical history.  History reviewed. No pertinent family history.  Social History   Socioeconomic History   Marital status: Married    Spouse name: Not on file   Number of children: Not on file   Years of education: Not on file   Highest education level: Not on file  Occupational History   Not on file  Tobacco Use   Smoking status: Never   Smokeless tobacco: Never  Substance and Sexual Activity   Alcohol use: No   Drug use: No   Sexual activity: Not on file  Other Topics Concern   Not on file  Social History Narrative   Not on file   Social Determinants of Health   Financial Resource Strain: Not on file  Food Insecurity: Not on file  Transportation Needs: Not on file  Physical Activity: Not on file  Stress: Not on file  Social Connections: Not on file  Intimate Partner Violence: Not on file    Outpatient Medications Prior to Visit  Medication Sig Dispense Refill   atorvastatin (LIPITOR) 40 MG tablet TAKE 1 TABLET(40 MG) BY MOUTH DAILY 90 tablet 3   fluticasone (FLONASE) 50 MCG/ACT nasal  spray Place 1 spray into both nostrils 2 (two) times daily. (Patient not taking: Reported on 08/25/2022) 16 g 2   Multiple Vitamin (MULTIVITAMIN) capsule Take 1 capsule by mouth daily. (Patient not taking: Reported on 08/25/2022)     ibuprofen (ADVIL,MOTRIN) 200 MG tablet Take 699 mg by mouth every 6 (six) hours as needed. For headaches and general body pain (Patient not taking: Reported on 08/25/2022)     predniSONE (DELTASONE) 50 MG tablet Take 1 tab daily with breakfast x 3 days (Patient not taking: Reported on 08/25/2022) 3 tablet 0   pseudoephedrine (SUDAFED 12 HOUR) 120 MG 12 hr tablet Take 1 tablet (120 mg total) by mouth every 12 (twelve) hours as needed. (Patient not taking: Reported on 08/25/2022) 10 tablet 0   No facility-administered medications prior to visit.    No Known Allergies  ROS Review of Systems  Constitutional:  Negative for chills and fever.  HENT:  Negative for congestion and sore throat.   Eyes:  Negative for pain and discharge.  Respiratory:  Negative for cough and shortness of breath.   Cardiovascular:  Negative for chest pain and palpitations.  Gastrointestinal:  Negative for constipation, diarrhea, nausea and vomiting.  Endocrine: Negative for polydipsia and polyuria.  Genitourinary:  Negative for dysuria and hematuria.  Musculoskeletal:  Negative for  neck pain and neck stiffness.  Skin:  Negative for rash.  Neurological:  Negative for dizziness, weakness, numbness and headaches.  Psychiatric/Behavioral:  Negative for agitation and behavioral problems.       Objective:    Physical Exam Vitals reviewed.  Constitutional:      General: He is not in acute distress.    Appearance: He is not diaphoretic.  HENT:     Head: Normocephalic and atraumatic.     Nose: Nose normal.     Mouth/Throat:     Mouth: Mucous membranes are moist.  Eyes:     General: No scleral icterus.    Extraocular Movements: Extraocular movements intact.     Pupils: Pupils are equal, round,  and reactive to light.  Cardiovascular:     Rate and Rhythm: Normal rate and regular rhythm.     Pulses: Normal pulses.     Heart sounds: Normal heart sounds. No murmur heard. Pulmonary:     Breath sounds: Normal breath sounds. No wheezing or rales.  Abdominal:     Palpations: Abdomen is soft.     Tenderness: There is no abdominal tenderness.  Musculoskeletal:     Cervical back: Neck supple. No tenderness.     Right lower leg: No edema.     Left lower leg: No edema.  Skin:    General: Skin is warm.     Findings: No rash.  Neurological:     General: No focal deficit present.     Mental Status: He is alert and oriented to person, place, and time.     Sensory: No sensory deficit.     Motor: No weakness.  Psychiatric:        Mood and Affect: Mood normal.        Behavior: Behavior normal.     BP 130/84 (BP Location: Right Arm, Cuff Size: Normal)   Pulse 77   Ht 5\' 9"  (1.753 m)   Wt 193 lb 9.6 oz (87.8 kg)   SpO2 94%   BMI 28.59 kg/m  Wt Readings from Last 3 Encounters:  08/25/22 193 lb 9.6 oz (87.8 kg)  07/08/21 188 lb (85.3 kg)  08/27/20 182 lb 1.9 oz (82.6 kg)    Lab Results  Component Value Date   TSH 2.010 07/08/2021   Lab Results  Component Value Date   WBC 3.9 07/08/2021   HGB 16.0 07/08/2021   HCT 48.0 07/08/2021   MCV 83 07/08/2021   PLT 277 07/08/2021   Lab Results  Component Value Date   NA 140 07/08/2021   K 4.5 07/08/2021   CO2 25 07/08/2021   GLUCOSE 94 07/08/2021   BUN 10 07/08/2021   CREATININE 1.07 07/08/2021   BILITOT 0.3 07/08/2021   ALKPHOS 65 07/08/2021   AST 37 07/08/2021   ALT 49 (H) 07/08/2021   PROT 6.9 07/08/2021   ALBUMIN 4.5 07/08/2021   CALCIUM 9.9 07/08/2021   ANIONGAP 6 11/19/2016   EGFR 92 07/08/2021   Lab Results  Component Value Date   CHOL 176 07/08/2021   Lab Results  Component Value Date   HDL 52 07/08/2021   Lab Results  Component Value Date   LDLCALC 106 (H) 07/08/2021   Lab Results  Component Value  Date   TRIG 96 07/08/2021   Lab Results  Component Value Date   CHOLHDL 3.4 07/08/2021   Lab Results  Component Value Date   HGBA1C 6.0 (H) 07/08/2021      Assessment & Plan:  Problem List Items Addressed This Visit       Other   Hyperlipidemia    On Atorvastatin Check lipid profile      Relevant Orders   Lipid panel   Prediabetes    Check HbA1C Continue to follow low carb diet      Relevant Orders   Hemoglobin A1c   CMP14+EGFR   CBC with Differential/Platelet   Prehypertension    BP Readings from Last 1 Encounters:  08/25/22 130/84  Advised DASH diet and moderate exercise/walking, at least 150 mins/week      Relevant Orders   TSH   CBC with Differential/Platelet   Encounter for general adult medical examination with abnormal findings - Primary    Physical exam as documented. Counseling done  re healthy lifestyle involving commitment to 150 minutes exercise per week, heart healthy diet, and attaining healthy weight.The importance of adequate sleep also discussed. Changes in health habits are decided on by the patient with goals and time frames  set for achieving them. Immunization and cancer screening needs are specifically addressed at this visit.      Relevant Orders   TSH   CMP14+EGFR   CBC with Differential/Platelet   Other Visit Diagnoses     Vitamin D deficiency       Relevant Orders   VITAMIN D 25 Hydroxy (Vit-D Deficiency, Fractures)       No orders of the defined types were placed in this encounter.   Follow-up: Return in about 1 year (around 08/25/2023) for Annual physical.    Lindell Spar, MD

## 2022-08-25 NOTE — Assessment & Plan Note (Signed)
Check HbA1C °Continue to follow low carb diet °

## 2022-08-26 ENCOUNTER — Encounter: Payer: Self-pay | Admitting: Internal Medicine

## 2022-08-26 ENCOUNTER — Other Ambulatory Visit: Payer: Self-pay | Admitting: Internal Medicine

## 2022-08-26 DIAGNOSIS — E782 Mixed hyperlipidemia: Secondary | ICD-10-CM

## 2022-08-26 DIAGNOSIS — R7303 Prediabetes: Secondary | ICD-10-CM

## 2022-08-26 LAB — LIPID PANEL
Chol/HDL Ratio: 4.6 ratio (ref 0.0–5.0)
Cholesterol, Total: 260 mg/dL — ABNORMAL HIGH (ref 100–199)
HDL: 57 mg/dL (ref >39)
LDL Chol Calc (NIH): 191 mg/dL — ABNORMAL HIGH (ref 0–99)
Triglycerides: 75 mg/dL (ref 0–149)
VLDL Cholesterol Cal: 12 mg/dL (ref 5–40)

## 2022-08-26 LAB — CMP14+EGFR
ALT: 58 IU/L — ABNORMAL HIGH (ref 0–44)
AST: 30 IU/L (ref 0–40)
Albumin/Globulin Ratio: 2 (ref 1.2–2.2)
Albumin: 4.5 g/dL (ref 4.1–5.1)
Alkaline Phosphatase: 65 IU/L (ref 44–121)
BUN/Creatinine Ratio: 11 (ref 9–20)
BUN: 12 mg/dL (ref 6–20)
Bilirubin Total: 0.3 mg/dL (ref 0.0–1.2)
CO2: 24 mmol/L (ref 20–29)
Calcium: 9.7 mg/dL (ref 8.7–10.2)
Chloride: 103 mmol/L (ref 96–106)
Creatinine, Ser: 1.14 mg/dL (ref 0.76–1.27)
Globulin, Total: 2.3 g/dL (ref 1.5–4.5)
Glucose: 98 mg/dL (ref 70–99)
Potassium: 4.7 mmol/L (ref 3.5–5.2)
Sodium: 140 mmol/L (ref 134–144)
Total Protein: 6.8 g/dL (ref 6.0–8.5)
eGFR: 85 mL/min/{1.73_m2} (ref >59)

## 2022-08-26 LAB — CBC WITH DIFFERENTIAL/PLATELET
Basophils Absolute: 0 10*3/uL (ref 0.0–0.2)
Basos: 1 %
EOS (ABSOLUTE): 0.1 10*3/uL (ref 0.0–0.4)
Eos: 2 %
Hematocrit: 46.2 % (ref 37.5–51.0)
Hemoglobin: 15.5 g/dL (ref 13.0–17.7)
Immature Grans (Abs): 0 10*3/uL (ref 0.0–0.1)
Immature Granulocytes: 0 %
Lymphocytes Absolute: 1.7 10*3/uL (ref 0.7–3.1)
Lymphs: 46 %
MCH: 28.4 pg (ref 26.6–33.0)
MCHC: 33.5 g/dL (ref 31.5–35.7)
MCV: 85 fL (ref 79–97)
Monocytes Absolute: 0.3 10*3/uL (ref 0.1–0.9)
Monocytes: 9 %
Neutrophils Absolute: 1.5 10*3/uL (ref 1.4–7.0)
Neutrophils: 42 %
Platelets: 254 10*3/uL (ref 150–450)
RBC: 5.46 x10E6/uL (ref 4.14–5.80)
RDW: 13.2 % (ref 11.6–15.4)
WBC: 3.6 10*3/uL (ref 3.4–10.8)

## 2022-08-26 LAB — HEMOGLOBIN A1C
Est. average glucose Bld gHb Est-mCnc: 123 mg/dL
Hgb A1c MFr Bld: 5.9 % — ABNORMAL HIGH (ref 4.8–5.6)

## 2022-08-26 LAB — TSH: TSH: 1.9 u[IU]/mL (ref 0.450–4.500)

## 2022-08-26 LAB — VITAMIN D 25 HYDROXY (VIT D DEFICIENCY, FRACTURES): Vit D, 25-Hydroxy: 19.9 ng/mL — ABNORMAL LOW (ref 30.0–100.0)

## 2023-08-27 ENCOUNTER — Encounter: Payer: BC Managed Care – PPO | Admitting: Internal Medicine

## 2023-09-20 ENCOUNTER — Other Ambulatory Visit: Payer: Self-pay | Admitting: Internal Medicine

## 2023-09-20 DIAGNOSIS — E782 Mixed hyperlipidemia: Secondary | ICD-10-CM

## 2023-10-26 ENCOUNTER — Ambulatory Visit: Payer: Self-pay | Admitting: Internal Medicine

## 2023-10-26 ENCOUNTER — Encounter: Payer: Self-pay | Admitting: Internal Medicine

## 2023-10-26 VITALS — BP 136/82 | HR 89 | Ht 69.0 in | Wt 196.6 lb

## 2023-10-26 DIAGNOSIS — R030 Elevated blood-pressure reading, without diagnosis of hypertension: Secondary | ICD-10-CM | POA: Diagnosis not present

## 2023-10-26 NOTE — Assessment & Plan Note (Signed)
 Presenting today for an acute visit in the setting of elevated blood pressure readings noted at his recent [employer] physical.  His manually assessed BP in office today is 136/82.  Not meeting criteria for hypertension.  No indication to start antihypertensive therapy.  He regularly checks his blood pressure at home and reports readings that are typically around 130/80.  I have completed requested paperwork noting that his blood pressure is less than 140/90.  I recommended that Colin Cunningham regularly engage in cardiovascular exercise and follow a DASH diet in an effort to improve his blood pressure.  He will return to care for previously scheduled follow-up with Dr. Lydia Sams in 6/16.

## 2023-10-26 NOTE — Progress Notes (Signed)
 Acute Office Visit  Subjective:     Patient ID: Colin Cunningham, male    DOB: [birth year], 38 y.o.   MRN: [id number]  Chief Complaint  Patient presents with   prehypertension    Patient blood pressure has been off and on    Colin Cunningham presents today for an acute visit with concern for elevated blood pressure readings.  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  He has a documented history of prehypertension, but has never been formally diagnosed with hypertension.  Colin Cunningham is asymptomatic currently.  Review of Systems  Constitutional:  Negative for chills and fever.  HENT:  Negative for sore throat.   Respiratory:  Negative for cough and shortness of breath.   Cardiovascular:  Negative for chest pain, palpitations and leg swelling.  Gastrointestinal:  Negative for abdominal pain, blood in stool, constipation, diarrhea, nausea and vomiting.  Genitourinary:  Negative for dysuria and hematuria.  Musculoskeletal:  Negative for myalgias.  Skin:  Negative for itching and rash.  Neurological:  Negative for dizziness and headaches.  Psychiatric/Behavioral:  Negative for depression and suicidal ideas.       Objective:    BP 136/82   Pulse 89   Ht 5\' 9"  (1.753 m)   Wt 196 lb 9.6 oz (89.2 kg)   SpO2 98%   BMI 29.03 kg/m  BP Readings from Last 3 Encounters:  10/26/23 136/82  08/25/22 130/84  12/26/21 127/81   Physical Exam Vitals reviewed.  Constitutional:      General: He is not in acute distress.    Appearance: Normal appearance. He is not ill-appearing.  HENT:     Head: Normocephalic and atraumatic.     Right Ear: External ear normal.     Left Ear: External ear normal.     Nose: Nose normal. No congestion or rhinorrhea.     Mouth/Throat:     Mouth: Mucous membranes are moist.     Pharynx: Oropharynx is clear.  Eyes:     General: No scleral icterus.    Extraocular Movements: Extraocular movements intact.      Conjunctiva/sclera: Conjunctivae normal.     Pupils: Pupils are equal, round, and reactive to light.  Cardiovascular:     Rate and Rhythm: Normal rate and regular rhythm.     Pulses: Normal pulses.     Heart sounds: Normal heart sounds. No murmur heard. Pulmonary:     Effort: Pulmonary effort is normal.     Breath sounds: Normal breath sounds. No wheezing, rhonchi or rales.  Abdominal:     General: Abdomen is flat. Bowel sounds are normal. There is no distension.     Palpations: Abdomen is soft.     Tenderness: There is no abdominal tenderness.  Musculoskeletal:        General: No swelling or deformity. Normal range of motion.     Cervical back: Normal range of motion.  Skin:    General: Skin is warm and dry.     Capillary Refill: Capillary refill takes less than 2 seconds.  Neurological:     General: No focal deficit present.     Mental Status: He is alert and oriented to person, place, and time.     Motor: No weakness.  Psychiatric:        Mood and Affect: Mood normal.        Behavior: Behavior normal.        Thought Content:  Thought content normal.       Assessment & Plan:   Problem List Items Addressed This Visit       Prehypertension - Primary   Presenting today for an acute visit in the setting of elevated blood pressure readings noted at his recent [employer] physical.  His manually assessed BP in office today is 136/82.  Not meeting criteria for hypertension.  No indication to start antihypertensive therapy.  He regularly checks his blood pressure at home and reports readings that are typically around 130/80.  I have completed requested paperwork noting that his blood pressure is less than 140/90.  I recommended that Colin Cunningham regularly engage in cardiovascular exercise and follow a DASH diet in an effort to improve his blood pressure.  He will return to care for previously scheduled follow-up with Dr. Lydia Sams in 6/16.      Return in about 2 weeks (around  11/09/2023).  Tobi Fortes, MD

## 2023-10-26 NOTE — Patient Instructions (Signed)
 It was a pleasure to see you today.  Thank you for giving us  the opportunity to be involved in your care.  Below is a brief recap of your visit and next steps.  We will plan to see you again in 6/16.  Summary No indication to start antihypertensive medication today Recommend engaging in regular cardiovascular activity

## 2023-11-09 ENCOUNTER — Encounter: Admitting: Internal Medicine

## 2023-11-17 ENCOUNTER — Encounter: Payer: Self-pay | Admitting: Internal Medicine

## 2024-03-01 ENCOUNTER — Encounter: Admitting: Internal Medicine

## 2024-04-04 ENCOUNTER — Ambulatory Visit: Admitting: Internal Medicine

## 2024-04-04 ENCOUNTER — Encounter: Payer: Self-pay | Admitting: Internal Medicine

## 2024-04-04 VITALS — BP 136/77 | HR 95 | Ht 69.0 in | Wt 197.2 lb

## 2024-04-04 DIAGNOSIS — Z8042 Family history of malignant neoplasm of prostate: Secondary | ICD-10-CM | POA: Insufficient documentation

## 2024-04-04 DIAGNOSIS — E559 Vitamin D deficiency, unspecified: Secondary | ICD-10-CM

## 2024-04-04 DIAGNOSIS — R7303 Prediabetes: Secondary | ICD-10-CM | POA: Diagnosis not present

## 2024-04-04 DIAGNOSIS — Z0001 Encounter for general adult medical examination with abnormal findings: Secondary | ICD-10-CM

## 2024-04-04 DIAGNOSIS — D751 Secondary polycythemia: Secondary | ICD-10-CM

## 2024-04-04 DIAGNOSIS — E782 Mixed hyperlipidemia: Secondary | ICD-10-CM

## 2024-04-04 DIAGNOSIS — Z23 Encounter for immunization: Secondary | ICD-10-CM | POA: Diagnosis not present

## 2024-04-04 DIAGNOSIS — R030 Elevated blood-pressure reading, without diagnosis of hypertension: Secondary | ICD-10-CM

## 2024-04-04 NOTE — Patient Instructions (Signed)
 Please continue taking Atorvastatin  regularly.  Please continue to take medications as prescribed.  Please continue to follow low cholesterol diet and perform moderate exercise/walking at least 150 mins/week.

## 2024-04-04 NOTE — Assessment & Plan Note (Signed)
 Your father and paternal uncles have history of prostate cancer in 67s Will get PSA level once he is 40

## 2024-04-04 NOTE — Assessment & Plan Note (Signed)
 BP Readings from Last 1 Encounters:  04/04/24 136/77   Advised DASH diet and moderate exercise/walking, at least 150 mins/week

## 2024-04-04 NOTE — Assessment & Plan Note (Signed)
 On Atorvastatin  40 mg QD, compliance questionable Check lipid profile Concern for familial hypercholesterolemia, check lipoprotein a

## 2024-04-04 NOTE — Assessment & Plan Note (Signed)

## 2024-04-04 NOTE — Assessment & Plan Note (Signed)
 Lab Results  Component Value Date   HGBA1C 5.9 (H) 08/25/2022   Continue to follow low carb diet

## 2024-04-04 NOTE — Progress Notes (Unsigned)
 Established Patient Office Visit  Subjective:  Patient ID: Colin Cunningham, male    DOB: 11-Aug-1985  Age: 38 y.o. MRN: [id number]  CC:  Chief Complaint  Patient presents with   Annual Exam    Cpe.   Hyperlipidemia    Follow up   Hypertension    Follow up     HPI Colin Cunningham is a 38 y.o. male with past medical history of HLD and prediabetes who presents for annual physical.  He has h/o prediabtetes. He follows low carb diet and performs regular exercise. He takes Atorvastatin  for HLD.  He currently works in a first shift, and has to wake up at 2 AM.  He is concerned about his health due to sleep cycle disruptions.  I had discussion about getting at least 7-8 hours of uninterrupted sleep.  He is trying to follow low-carb diet.  He received Tdap vaccine in the office today.  Past Medical History:  Diagnosis Date   High cholesterol     History reviewed. No pertinent surgical history.  History reviewed. No pertinent family history.  Social History   Socioeconomic History   Marital status: Married    Spouse name: Not on file   Number of children: Not on file   Years of education: Not on file   Highest education level: Associate degree: occupational, scientist, product/process development, or vocational program  Occupational History   Not on file  Tobacco Use   Smoking status: Never   Smokeless tobacco: Never  Substance and Sexual Activity   Alcohol use: No   Drug use: No   Sexual activity: Not on file  Other Topics Concern   Not on file  Social History Narrative   Not on file   Social Drivers of Health   Financial Resource Strain: Low Risk  (04/03/2024)   Overall Financial Resource Strain (CARDIA)    Difficulty of Paying Living Expenses: Not hard at all  Food Insecurity: No Food Insecurity (04/03/2024)   Hunger Vital Sign    Worried About Running Out of Food in the Last Year: Never true    Ran Out of Food in the Last Year: Never true  Transportation Needs: No Transportation Needs  (04/03/2024)   PRAPARE - Administrator, Civil Service (Medical): No    Lack of Transportation (Non-Medical): No  Physical Activity: Insufficiently Active (04/03/2024)   Exercise Vital Sign    Days of Exercise per Week: 1 day    Minutes of Exercise per Session: 20 min  Stress: No Stress Concern Present (04/03/2024)   [employer] of Occupational Health - Occupational Stress Questionnaire    Feeling of Stress: Only a little  Social Connections: Socially Integrated (04/03/2024)   Social Connection and Isolation Panel    Frequency of Communication with Friends and Family: More than three times a week    Frequency of Social Gatherings with Friends and Family: More than three times a week    Attends Religious Services: 1 to 4 times per year    Active Member of [employer] or Organizations: Yes    Attends Banker Meetings: 1 to 4 times per year    Marital Status: Married  Catering Manager Violence: Not on file    Outpatient Medications Prior to Visit  Medication Sig Dispense Refill   atorvastatin  (LIPITOR) 40 MG tablet TAKE 1 TABLET(40 MG) BY MOUTH DAILY 90 tablet 3   No facility-administered medications prior to visit.    No Known Allergies  ROS  Review of Systems  Constitutional:  Negative for chills and fever.  HENT:  Negative for congestion and sore throat.   Eyes:  Negative for pain and discharge.  Respiratory:  Negative for cough and shortness of breath.   Cardiovascular:  Negative for chest pain and palpitations.  Gastrointestinal:  Negative for constipation, diarrhea, nausea and vomiting.  Endocrine: Negative for polydipsia and polyuria.  Genitourinary:  Negative for dysuria and hematuria.  Musculoskeletal:  Negative for neck pain and neck stiffness.  Skin:  Negative for rash.  Neurological:  Negative for dizziness, weakness, numbness and headaches.  Psychiatric/Behavioral:  Negative for agitation and behavioral problems.       Objective:     Physical Exam Vitals reviewed.  Constitutional:      General: He is not in acute distress.    Appearance: He is not diaphoretic.  HENT:     Head: Normocephalic and atraumatic.     Nose: Nose normal.     Mouth/Throat:     Mouth: Mucous membranes are moist.  Eyes:     General: No scleral icterus.    Extraocular Movements: Extraocular movements intact.     Pupils: Pupils are equal, round, and reactive to light.  Cardiovascular:     Rate and Rhythm: Normal rate and regular rhythm.     Pulses: Normal pulses.     Heart sounds: Normal heart sounds. No murmur heard. Pulmonary:     Breath sounds: Normal breath sounds. No wheezing or rales.  Abdominal:     Palpations: Abdomen is soft.     Tenderness: There is no abdominal tenderness.  Musculoskeletal:     Cervical back: Neck supple. No tenderness.     Right lower leg: No edema.     Left lower leg: No edema.  Skin:    General: Skin is warm.     Findings: No rash.  Neurological:     General: No focal deficit present.     Mental Status: He is alert and oriented to person, place, and time.     Sensory: No sensory deficit.     Motor: No weakness.  Psychiatric:        Mood and Affect: Mood normal.        Behavior: Behavior normal.     BP 136/77   Pulse 95   Ht 5' 9 (1.753 m)   Wt 197 lb 3.2 oz (89.4 kg)   SpO2 99%   BMI 29.12 kg/m  Wt Readings from Last 3 Encounters:  04/04/24 197 lb 3.2 oz (89.4 kg)  10/26/23 196 lb 9.6 oz (89.2 kg)  08/25/22 193 lb 9.6 oz (87.8 kg)    Lab Results  Component Value Date   TSH 1.900 08/25/2022   Lab Results  Component Value Date   WBC 3.6 08/25/2022   HGB 15.5 08/25/2022   HCT 46.2 08/25/2022   MCV 85 08/25/2022   PLT 254 08/25/2022   Lab Results  Component Value Date   NA 140 08/25/2022   K 4.7 08/25/2022   CO2 24 08/25/2022   GLUCOSE 98 08/25/2022   BUN 12 08/25/2022   CREATININE 1.14 08/25/2022   BILITOT 0.3 08/25/2022   ALKPHOS 65 08/25/2022   AST 30 08/25/2022   ALT  58 (H) 08/25/2022   PROT 6.8 08/25/2022   ALBUMIN 4.5 08/25/2022   CALCIUM  9.7 08/25/2022   ANIONGAP 6 11/19/2016   EGFR 85 08/25/2022   Lab Results  Component Value Date   CHOL 260 (H) 08/25/2022  Lab Results  Component Value Date   HDL 57 08/25/2022   Lab Results  Component Value Date   LDLCALC 191 (H) 08/25/2022   Lab Results  Component Value Date   TRIG 75 08/25/2022   Lab Results  Component Value Date   CHOLHDL 4.6 08/25/2022   Lab Results  Component Value Date   HGBA1C 5.9 (H) 08/25/2022      Assessment & Plan:   Problem List Items Addressed This Visit       Other   Encounter for general adult medical examination with abnormal findings - Primary    No orders of the defined types were placed in this encounter.   Follow-up: No follow-ups on file.    Suzzane MARLA Blanch, MD

## 2024-04-07 ENCOUNTER — Other Ambulatory Visit: Payer: Self-pay | Admitting: Internal Medicine

## 2024-04-07 ENCOUNTER — Ambulatory Visit: Payer: Self-pay | Admitting: Internal Medicine

## 2024-04-07 DIAGNOSIS — E782 Mixed hyperlipidemia: Secondary | ICD-10-CM

## 2024-04-07 LAB — CBC WITH DIFFERENTIAL/PLATELET
Basophils Absolute: 0 x10E3/uL (ref 0.0–0.2)
Basos: 1 %
EOS (ABSOLUTE): 0.1 x10E3/uL (ref 0.0–0.4)
Eos: 1 %
Hematocrit: 48.2 % (ref 37.5–51.0)
Hemoglobin: 15.8 g/dL (ref 13.0–17.7)
Immature Grans (Abs): 0 x10E3/uL (ref 0.0–0.1)
Immature Granulocytes: 0 %
Lymphocytes Absolute: 2.3 x10E3/uL (ref 0.7–3.1)
Lymphs: 49 %
MCH: 28.6 pg (ref 26.6–33.0)
MCHC: 32.8 g/dL (ref 31.5–35.7)
MCV: 87 fL (ref 79–97)
Monocytes Absolute: 0.5 x10E3/uL (ref 0.1–0.9)
Monocytes: 10 %
Neutrophils Absolute: 1.8 x10E3/uL (ref 1.4–7.0)
Neutrophils: 39 %
Platelets: 272 x10E3/uL (ref 150–450)
RBC: 5.53 x10E6/uL (ref 4.14–5.80)
RDW: 13.6 % (ref 11.6–15.4)
WBC: 4.7 x10E3/uL (ref 3.4–10.8)

## 2024-04-07 LAB — CMP14+EGFR
ALT: 54 IU/L — ABNORMAL HIGH (ref 0–44)
AST: 32 IU/L (ref 0–40)
Albumin: 4.5 g/dL (ref 4.1–5.1)
Alkaline Phosphatase: 65 IU/L (ref 47–123)
BUN/Creatinine Ratio: 10 (ref 9–20)
BUN: 11 mg/dL (ref 6–20)
Bilirubin Total: 0.4 mg/dL (ref 0.0–1.2)
CO2: 24 mmol/L (ref 20–29)
Calcium: 9.8 mg/dL (ref 8.7–10.2)
Chloride: 102 mmol/L (ref 96–106)
Creatinine, Ser: 1.13 mg/dL (ref 0.76–1.27)
Globulin, Total: 2.6 g/dL (ref 1.5–4.5)
Glucose: 99 mg/dL (ref 70–99)
Potassium: 4.3 mmol/L (ref 3.5–5.2)
Sodium: 139 mmol/L (ref 134–144)
Total Protein: 7.1 g/dL (ref 6.0–8.5)
eGFR: 85 mL/min/1.73 (ref >59)

## 2024-04-07 LAB — LIPID PANEL
Chol/HDL Ratio: 4.6 ratio (ref 0.0–5.0)
Cholesterol, Total: 260 mg/dL — ABNORMAL HIGH (ref 100–199)
HDL: 56 mg/dL (ref >39)
LDL Chol Calc (NIH): 182 mg/dL — ABNORMAL HIGH (ref 0–99)
Triglycerides: 125 mg/dL (ref 0–149)
VLDL Cholesterol Cal: 22 mg/dL (ref 5–40)

## 2024-04-07 LAB — HEMOGLOBIN A1C
Est. average glucose Bld gHb Est-mCnc: 126 mg/dL
Hgb A1c MFr Bld: 6 % — ABNORMAL HIGH (ref 4.8–5.6)

## 2024-04-07 LAB — TSH: TSH: 1.76 u[IU]/mL (ref 0.450–4.500)

## 2024-04-07 LAB — LIPOPROTEIN A (LPA): Lipoprotein (a): 434.8 nmol/L — ABNORMAL HIGH (ref <75.0)

## 2024-04-07 LAB — VITAMIN D 25 HYDROXY (VIT D DEFICIENCY, FRACTURES): Vit D, 25-Hydroxy: 17.1 ng/mL — ABNORMAL LOW (ref 30.0–100.0)

## 2024-04-07 MED ORDER — ATORVASTATIN CALCIUM 40 MG PO TABS: 40.0000 mg | ORAL_TABLET | Freq: Every day | ORAL | 3 refills | Status: AC
